# Patient Record
Sex: Male | Born: 1935 | Race: White | Hispanic: No | Marital: Married | Smoking: Never smoker
Health system: Southern US, Community
[De-identification: ages and names within clinical notes are randomized; demographics above are authoritative.]

## PROBLEM LIST (undated history)

## (undated) DIAGNOSIS — C61 Malignant neoplasm of prostate: Secondary | ICD-10-CM

## (undated) DIAGNOSIS — K648 Other hemorrhoids: Secondary | ICD-10-CM

## (undated) DIAGNOSIS — K5790 Diverticulosis of intestine, part unspecified, without perforation or abscess without bleeding: Secondary | ICD-10-CM

## (undated) DIAGNOSIS — J42 Unspecified chronic bronchitis: Secondary | ICD-10-CM

## (undated) DIAGNOSIS — K219 Gastro-esophageal reflux disease without esophagitis: Secondary | ICD-10-CM

## (undated) DIAGNOSIS — E785 Hyperlipidemia, unspecified: Secondary | ICD-10-CM

## (undated) DIAGNOSIS — D126 Benign neoplasm of colon, unspecified: Secondary | ICD-10-CM

## (undated) DIAGNOSIS — E119 Type 2 diabetes mellitus without complications: Secondary | ICD-10-CM

## (undated) HISTORY — DX: Malignant neoplasm of prostate: C61

## (undated) HISTORY — DX: Diverticulosis of intestine, part unspecified, without perforation or abscess without bleeding: K57.90

## (undated) HISTORY — PX: INGUINAL HERNIA REPAIR: SUR1180

## (undated) HISTORY — DX: Other hemorrhoids: K64.8

## (undated) HISTORY — PX: LASIK: SHX215

## (undated) HISTORY — PX: TOTAL KNEE ARTHROPLASTY: SHX125

## (undated) HISTORY — PX: APPENDECTOMY: SHX54

## (undated) HISTORY — DX: Hyperlipidemia, unspecified: E78.5

## (undated) HISTORY — DX: Benign neoplasm of colon, unspecified: D12.6

---
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
## 1978-08-24 HISTORY — PX: CHOLECYSTECTOMY: SHX55

## 1999-12-24 ENCOUNTER — Encounter: Admission: RE | Admit: 1999-12-24 | Discharge: 2000-01-14 | Payer: Self-pay | Admitting: Orthopedic Surgery

## 2000-10-05 ENCOUNTER — Ambulatory Visit (HOSPITAL_COMMUNITY): Admission: RE | Admit: 2000-10-05 | Discharge: 2000-10-05 | Payer: Self-pay | Admitting: *Deleted

## 2000-10-05 ENCOUNTER — Encounter: Payer: Self-pay | Admitting: *Deleted

## 2000-12-03 ENCOUNTER — Encounter (INDEPENDENT_AMBULATORY_CARE_PROVIDER_SITE_OTHER): Payer: Self-pay | Admitting: Specialist

## 2000-12-03 ENCOUNTER — Ambulatory Visit (HOSPITAL_COMMUNITY): Admission: RE | Admit: 2000-12-03 | Discharge: 2000-12-03 | Payer: Self-pay | Admitting: *Deleted

## 2001-09-22 ENCOUNTER — Encounter: Payer: Self-pay | Admitting: Urology

## 2001-09-22 ENCOUNTER — Encounter: Admission: RE | Admit: 2001-09-22 | Discharge: 2001-09-22 | Payer: Self-pay | Admitting: Urology

## 2002-08-24 DIAGNOSIS — D126 Benign neoplasm of colon, unspecified: Secondary | ICD-10-CM

## 2002-08-24 HISTORY — DX: Benign neoplasm of colon, unspecified: D12.6

## 2003-09-17 ENCOUNTER — Inpatient Hospital Stay (HOSPITAL_COMMUNITY): Admission: RE | Admit: 2003-09-17 | Discharge: 2003-09-18 | Payer: Self-pay | Admitting: Neurosurgery

## 2005-05-26 ENCOUNTER — Ambulatory Visit (HOSPITAL_COMMUNITY): Admission: RE | Admit: 2005-05-26 | Discharge: 2005-05-26 | Payer: Self-pay | Admitting: Chiropractic Medicine

## 2006-04-08 ENCOUNTER — Encounter: Admission: RE | Admit: 2006-04-08 | Discharge: 2006-04-08 | Payer: Self-pay | Admitting: Orthopedic Surgery

## 2007-08-08 ENCOUNTER — Encounter: Admission: RE | Admit: 2007-08-08 | Discharge: 2007-08-08 | Payer: Self-pay | Admitting: Orthopedic Surgery

## 2008-04-18 ENCOUNTER — Ambulatory Visit: Payer: Self-pay | Admitting: Gastroenterology

## 2008-07-30 ENCOUNTER — Ambulatory Visit: Payer: Self-pay | Admitting: Gastroenterology

## 2008-08-08 ENCOUNTER — Inpatient Hospital Stay (HOSPITAL_COMMUNITY): Admission: RE | Admit: 2008-08-08 | Discharge: 2008-08-12 | Payer: Self-pay | Admitting: Orthopedic Surgery

## 2008-09-05 ENCOUNTER — Encounter: Admission: RE | Admit: 2008-09-05 | Discharge: 2008-11-09 | Payer: Self-pay | Admitting: Orthopedic Surgery

## 2010-02-14 ENCOUNTER — Ambulatory Visit (HOSPITAL_COMMUNITY): Admission: RE | Admit: 2010-02-14 | Discharge: 2010-02-14 | Payer: Self-pay | Admitting: General Surgery

## 2010-11-09 LAB — GLUCOSE, CAPILLARY: Glucose-Capillary: 173 mg/dL — ABNORMAL HIGH (ref 70–99)

## 2010-11-09 LAB — COMPREHENSIVE METABOLIC PANEL
ALT: 23 U/L (ref 0–53)
AST: 21 U/L (ref 0–37)
Albumin: 4.1 g/dL (ref 3.5–5.2)
Alkaline Phosphatase: 57 U/L (ref 39–117)
BUN: 18 mg/dL (ref 6–23)
Chloride: 107 mEq/L (ref 96–112)
GFR calc Af Amer: >60 mL/min (ref >60)
Total Protein: 7.1 g/dL (ref 6.0–8.3)

## 2010-11-09 LAB — DIFFERENTIAL: Lymphocytes Relative: 17 % (ref 12–46)

## 2010-11-09 LAB — CBC
HCT: 39.7 % (ref 39.0–52.0)
Hemoglobin: 13 g/dL (ref 13.0–17.0)
RBC: 4.32 MIL/uL (ref 4.22–5.81)
RDW: 13.9 % (ref 11.5–15.5)
WBC: 7.6 10*3/uL (ref 4.0–10.5)

## 2010-11-09 LAB — SURGICAL PCR SCREEN
MRSA, PCR: NEGATIVE
Staphylococcus aureus: NEGATIVE

## 2011-01-06 NOTE — Op Note (Signed)
NAMEDARYAN, BUELL              ACCOUNT NO.:  [id number]   MEDICAL RECORD NO.:  [id number]          PATIENT TYPE:  INP   LOCATION:  NA                           FACILITY:  [facility]   PHYSICIAN:  Ollen Gross, M.D.    DATE OF BIRTH:  1932/04/28   DATE OF PROCEDURE:  08/08/2008  DATE OF DISCHARGE:                               OPERATIVE REPORT   PREOP DIAGNOSIS:  Osteoarthritis, right knee.   POSTOPERATIVE DIAGNOSIS:  Osteoarthritis, right knee.   PROCEDURE:  Right total knee arthroplasty.   SURGEON:  Ollen Gross, M.D.   ASSISTANT:  [name], P.A.C.   ANESTHESIA:  General with postop Marcaine pain pump.   ESTIMATED BLOOD LOSS:  Minimal.   DRAINS:  None.   TOURNIQUET TIME:  36 minutes at 300 mmHg.   COMPLICATIONS:  None.   CONDITION:  Stable to recovery.   CLINICAL NOTE:  Mr. Zalesky is a 77 year old male who has severe  endstage arthritis of the right knee with progressively worsening pain  and dysfunction.  He has failed arthroscopy and extensive nonoperative  management including injections. Presents now for right total knee  arthroplasty.   PROCEDURE IN DETAIL:  After successful administration of general  anesthetic, a tourniquet is placed high on his right thigh and right  lower extremity, prepped and draped in the usual sterile fashion.  Extremities wrapped in Esmarch, knee flexed, tourniquet inflated to 300  mmHg.  Midline incision made with a 10 blade through subcutaneous tissue  to the level of the extensor mechanism.  A fresh blade is used make a  medial parapatellar arthrotomy.  Soft tissue over the proximal medial  tibia is subperiosteally elevated to the joint line with the knife into  the semimembranosus bursa with a Cobb elevator.  Soft tissue laterally  is elevated with attention being paid to avoid patellar tendon on tibial  tubercle.  Patella subluxed laterally, knee flexed 90 degrees, and ACL  and PCL removed.  Drill was used create a  starting hole in the distal  femur and the canal was thoroughly irrigated.  Five-degree right valgus  alignment guide is placed to reference the posterior condyles, rotations  marked, and the block pinned to remove 11 mm of distal femur. It took 11  because of preop flexion contracture.  Distal femoral resection is made  with an oscillating saw.  Sizing blocks placed and sized, 5 is most  appropriate.  Rotations marked at the epicondylar axis.  Size 5 cutting  blocks placed and the anterior-posterior chamfer cuts made.   Tibia is subluxed forward and menisci removed.  The extramedullary  tibial alignment guide is placed referencing proximally at the medial  aspect of the tibial tubercle and distally along the second metatarsal  axis and tibial crest.  A block is pinned to remove 10 mm of the non  deficient lateral side.  Tibial resection is made with an oscillating  saw.  Osteophytes removed and size four is the most appropriate tibial  component.  The proximal tibia is prepared to modular drill and keel  punch for the size four.  Femoral preparation is completed with the  intercondylar cut for the size five.   Size four mobile bearing tibial trial size five posterior stabilized  femoral trial 810-mm posterior stabilized rotating platform insert trial  are placed.  The tendon and a tiny bit of anterior-posterior placed with  the 12.5 which allowed for full extension with excellent varus-valgus  anterior and posterior balance.  The patella was then everted and  thickness measured to be 25 mm.  Freehand resection was taken to 14 mm,  41 template is placed, lug holes were drilled, trial patella was placed  and it tracks normally.  Osteophytes removed off the posterior femur  with the trial in place.  All trials were removed and the cut bone  surfaces are prepared with pulsatile lavage.  Cement was mixed and once  ready for implantation, the size four mobile bearing tibial tray size  five  posterior stabilized femur and 41 patella are cemented into place.  Patella was held with a clamp.  Trial 12.5-mm insert is placed, knee  held in full extension and all extruded cement removed.  When cement is  fully hardened then the permanent 12.5 mm posterior stabilized rotating  platform insert is placed in the tibial tray.  Wounds copiously  irrigated with saline solution and FloSeal injected on the posterior  capsule, medial and lateral gutters and suprapatellar area.  Moist  sponge is placed and tourniquet released with total time of 36 minutes.  The sponge was held for 2 minutes then removed.  Minimal bleeding was  encountered.  The bleeding that is encountered is stopped with  electrocautery.  The wounds again irrigated and the arthrotomy closed  with interrupted #1 PDS.  Flexion against gravity is 140 degrees.  Subcu  is closed with interrupted 2-0 Vicryl subcuticular running 4-0 Monocryl.  Incisions cleaned and dried and then the catheter for the Marcaine pain  pump was placed and the pump initiated.  Steri-Strips and a bulky  sterile dressing were applied.  The knee was placed into a knee  immobilizer, the patient awakened and transported to recovery in stable  condition.      Ollen Gross, M.D.  Electronically Signed     FA/MEDQ  D:  08/08/2008  T:  08/09/2008  Job:  [id number]

## 2011-01-06 NOTE — H&P (Signed)
Andrew [name], Andrew [name]              ACCOUNT NO.:  [id number]   MEDICAL RECORD NO.:  [id number]          PATIENT TYPE:  INP   LOCATION:  1621                         FACILITY:  [facility]   PHYSICIAN:  Ollen Gross, M.D.    DATE OF BIRTH:  1932/01/05   DATE OF ADMISSION:  08/08/2008  DATE OF DISCHARGE:                              HISTORY & PHYSICAL   CHIEF COMPLAINT:  Right knee pain.   HISTORY OF PRESENT ILLNESS:  The patient is a 77 year old male who has  been seen by Dr. Lequita Halt for ongoing right knee pain. He was seen as a  second opinion with progressive pain in the right knee.  He is found to  have end-stage arthritis that has been ongoing for quite some time now  and is felt to be good candidate for surgery.  He has been seen by C.  Duane Lope, M.D. preoperatively and felt to be stable. Dr. Tenny Craw  recommended contacting [facility] hospitalist if there were any problems in  the hospital.  The patient was subsequently to [facility].   ALLERGIES:  No known drug allergies.   CURRENT MEDICATIONS:  Lipitor and Janumet.   PAST MEDICAL HISTORY:  Diabetes mellitus and also hyperlipidemia.   PAST SURGICAL HISTORY:  Gallbladder surgery and rotator cuff repair.   FAMILY HISTORY:  Mother with history of cancer.   SOCIAL HISTORY:  Widowed.  Business owner.  Nonsmoker.  No alcohol.  Lives alone.  He does have a caregiver lined up.  He also wants to look  into possible rehab stay. He does have a living will.   REVIEW OF SYSTEMS:  GENERAL:  No fevers, chills, night sweats.  NEURO:  No seizures, syncope, or paralysis.  RESPIRATORY:  No shortness breath,  productive cough, or hemoptysis.  CARDIOVASCULAR:  No chest pain or  orthopnea.  GI: No nausea, vomiting, diarrhea, constipation.  GU: No  dysuria, hematuria, or discharge.  MUSCULOSKELETAL:  Right knee.   PHYSICAL EXAMINATION:  VITAL SIGNS: Pulse 76, respirations 12, blood  pressure 120/62.  GENERAL: A 77 year old white male well-nourished,  well-developed, in no  acute distress. He is alert, oriented, and cooperative, good historian.  HEENT: Normocephalic, atraumatic.  Pupils are equal, round, and  reactive.  EOMs intact.  Oropharynx is clear.  Neck is supple.  No  bruits appreciated.  CHEST:  Clear anterior and posterior chest walls.  No rhonchi, rales,  wheezing.  HEART: Regular rhythm.  No murmur, S1-S2 noted.  ABDOMEN: Soft, nontender.  Bowel sounds present.  RECTAL, BREASTS, GENITALIA:  Not done, not pertinent to present illness.  EXTREMITIES:  Right knee:  Range of motion of the right knee shows 5-  130. Marked crepitus is noted.  Tender more medial than lateral in the  joint lines.   IMPRESSION:  Osteoarthritis right knee.   PLAN:  The patient was admitted to [facility] to undergo a  right total knee replacement arthroplasty.  Surgery will be performed by  Dr.  Ollen Gross. We will contact [facility] hospitalist if there are any  problems with the patient during the hospital course.  Andrew [name], P.A.C.      Ollen Gross, M.D.  Electronically Signed    ALP/MEDQ  D:  08/09/2008  T:  08/09/2008  Job:  [id number]   cc:   [name], M.D.  Fax: [phone]   Ollen Gross, M.D.  Fax: [phone]

## 2011-01-06 NOTE — Discharge Summary (Signed)
Andrew [name], [name]              ACCOUNT NO.:  [id number]   MEDICAL RECORD NO.:  [id number]          PATIENT TYPE:  INP   LOCATION:  1621                         FACILITY:  [facility]   PHYSICIAN:  Ollen Gross, M.D.    DATE OF BIRTH:  28-Jun-1932   DATE OF ADMISSION:  08/08/2008  DATE OF DISCHARGE:  08/12/2008                               DISCHARGE SUMMARY   ADMITTING DIAGNOSES:  1. Osteoarthritis, right knee.  2. Diabetes mellitus.  3. Hyperlipidemia.   DISCHARGE DIAGNOSES:  1. Osteoarthritis, right knee, status post right total knee      replacement arthroplasty.  2. Diabetes mellitus.  3. Hyperlipidemia.   PROCEDURE:  August 08, 2008, right total knee.   SURGEON:  Dr. Lequita Halt.   ASSISTANT:  Avel Peace, P.A.-C.   ANESTHESIA:  Genera and postop Marcaine pain pump.   CONSULTS:  None.   BRIEF HISTORY:  Andrew [name] is a 77 year old male with severe end-stage  arthritis of right knee, progressive worsening pain and dysfunction,  failed arthroscopy and extensive nonoperative management including  injections, now presents for total knee arthroplasty.   LABORATORY DATA:  Preop CBC showed a hemoglobin of 13.7, hematocrit of  41.6, white cell count 6.9, platelets 233.  Postop hemoglobin 11.7,  drifted down to 10.8.  Last noted H and H 11.1 and 33.2.  PT/PTT preop  13.1 and 33 respectively.  INR 1.0.  Serial pro times followed per  Coumadin protocol.  Last noted PT/INR 18.8 and 1.5.  Chem panel on  admission, slightly elevated total bili of 1.3, glucose elevated at 125.  Remaining Chem panel within normal limits.  Serial BMETs were followed.  Electrolytes remained within normal limits.  Preop UA, trace ketones,  otherwise negative.  Blood group type, B+.   EKG, July 31, 2008, normal sinus rhythm, nonspecific T-wave  abnormalities, no change from December 2005, confirmed by Dr. Bonnee Quin.   HOSPITAL COURSE:  Patient admitted to [facility], taken to the  OR,  underwent above-said procedure without any complication.  Patient  tolerated procedure well and later transferred to recovery room on  orthopedic floor and started PCA and p.o. analgesic pain control  following surgery.  Did get a little sleep the first night of surgery.  Actually doing pretty well on the morning of day 1.  Hemoglobin looked  good.  He took Janumet so we held the metformin portion of his diabetic  pill, started back on his other home meds.  Allowed him to be  weightbearing as tolerated.  He had an excellent urinary output.  He  actually got up and did very well, walked over 120 feet on day 1, weaned  over to p.o. meds.  PCA was discontinued by day 2, had excellent urinary  output.  Dressing change, incision looked good.  Continued to ambulate  well.  Remained in the hospital two more days.  We have arranged postop  home care.  Did have a little bit of leg pain on postop day 3.  His  Coumadin was not quite therapeutic, he was up to 1.4.  due to  the pain  and swelling, we observed him for 1 more day but by August 12, 2008,  he was doing well.  His Coumadin was slowly increasing.  He had a little  bit of redness with the swelling so he was treated prophylactically with  some Keflex by mouth.  Home arrangements were made and he was discharged  home on August 12, 2008.   PLAN:  1. Patient discharged home on August 12, 2008.  2. Discharge diagnoses:  Please see above.  3. Discharge meds:  Percocet, Robaxin, Coumadin, Lovenox 1 more day,      and then Keflex for 5 days.   FOLLOWUP:  Two weeks.   ACTIVITY:  Weightbearing as tolerated.  Total knee protocol.  Home  health PT and home health nursing.   DIET:  Heart-healthy diet.   DISPOSITION:  Home.   CONDITION UPON DISCHARGE:  Improved.      [name], P.A.C.      Ollen Gross, M.D.  Electronically Signed    ALP/MEDQ  D:  09/27/2008  T:  09/27/2008  Job:  [id number]   cc:   Ollen Gross,  M.D.  Fax: [phone]   [name], M.D.  Fax: [phone]

## 2011-01-09 NOTE — Op Note (Signed)
NAME:  Andrew [name], Andrew [name]                        ACCOUNT NO.:  [id number]   MEDICAL RECORD NO.:  [id number]                   PATIENT TYPE:  INP   LOCATION:  NA                                   FACILITY:  [facility]   PHYSICIAN:  Henry A. Pool, M.D.                 DATE OF BIRTH:  10/28/1931   DATE OF PROCEDURE:  09/17/2003  DATE OF DISCHARGE:                                 OPERATIVE REPORT   SERVICE:  Neurosurgery.   PREOPERATIVE DIAGNOSIS:  Right L4-5 and right L5-S1 spondylosis with  stenosis and radiculopathy.   POSTOPERATIVE DIAGNOSIS:  Right L4-5 and right L5-S1 spondylosis with  stenosis and radiculopathy.   PROCEDURE:  Right L4-5 and right L5-S1 decompressive laminotomy with  foraminotomies.   SURGEON:  Kathaleen Maser. Pool, M.D.   ASSISTANT:  Donalee Citrin, M.D.   ANESTHESIA:  General endotracheal.   INDICATION:  Mr. Tuminello is a 77 year old male with history of severe right  lower extremity radicular pain consistent with a right-sided L5  radiculopathy which has failed conservative management.  Workup has  demonstrated evidence of stenosis at L4-5 and at L5-S1 with compression of  the L5 nerve root within the lateral gutter at L4-5 and superiorly in the  lateral recess at L5-S1.  The patient has failed conservative management.  He presents now with two-level decompressive laminectomy and foraminotomies  for hopeful improvement of symptoms.   OPERATIVE NOTE:  The patient was brought to the operating room and placed on  the table in a supine position.  After an adequate level of anesthesia was  achieved, the patient was positioned prone onto a Wilson frame and properly  padded.  The patient's lumbar region was prepped and draped.  A 10 blade was  used to make an incision overlying the L4-5 interspace.  This area was  entered sharply in the midline.  Subperiosteal dissection was performed to  expose the lamina and facet joints of L4 and L5 on the right.  Deep self-  retainers were  placed.  Intraoperative x-rays were taken and level was  confirmed.  Decompressive laminotomy was then performed at L4-5 and L5-S1  using a high-speed drill and Kerrison rongeurs to remove the inferior aspect  of the lamina of L4 and the entire right L5 hemi-lamina and the medial  aspect of the L4-5 and L5-S1 facet joint complexes as well as the rim of the  L1 lamina.  The ligamentum flavum was then elevated and resected in  piecemeal fashion using Kerrison rongeurs at both levels.  The underlying  thecal sac and existing L5 and S1 nerve root were identified.  Microscope  was brought into the field to use for microdissection of the right-sided L5  nerve root.  A wide foraminotomy was then performed along the course of the  L5 nerve root by undercutting the facet joint as it tracks out into the  foramina.  A  foraminotomy was also performed along the course of the L1  nerve root.  The disks were inspected at both L5-S1 and L4-5.  Although they  were grossly degenerative and had some broad-based bulging, there was no  frank soft disk herniation.  It was felt that a simple decompression would  be the most appropriate course at both these levels.  Wound was then  irrigated with antibiotic solution.  Gelfoam was placed typically, where  hemostasis was found to be good.  Microscope and retractors were removed.  Hemostasis in the muscle was treated with  electrocautery.  Wound was then closed in layers with Vicryl suture.  Steri-  Strips and sterile dressing were applied.  There were no apparent  complications.  He tolerated the procedure well and he returned to the  recovery room  postop.                                               Henry A. Pool, M.D.    HAP/MEDQ  D:  09/17/2003  T:  09/17/2003  Job:  [id number]

## 2011-05-29 LAB — GLUCOSE, CAPILLARY
Glucose-Capillary: 116 mg/dL — ABNORMAL HIGH (ref 70–99)
Glucose-Capillary: 121 mg/dL — ABNORMAL HIGH (ref 70–99)
Glucose-Capillary: 141 mg/dL — ABNORMAL HIGH (ref 70–99)
Glucose-Capillary: 149 mg/dL — ABNORMAL HIGH (ref 70–99)
Glucose-Capillary: 151 mg/dL — ABNORMAL HIGH (ref 70–99)
Glucose-Capillary: 157 mg/dL — ABNORMAL HIGH (ref 70–99)
Glucose-Capillary: 170 mg/dL — ABNORMAL HIGH (ref 70–99)
Glucose-Capillary: 193 mg/dL — ABNORMAL HIGH (ref 70–99)

## 2011-05-29 LAB — CBC
HCT: 32.7 % — ABNORMAL LOW (ref 39.0–52.0)
HCT: 41.6 % (ref 39.0–52.0)
Hemoglobin: 11.1 g/dL — ABNORMAL LOW (ref 13.0–17.0)
Hemoglobin: 13.7 g/dL (ref 13.0–17.0)
MCHC: 33 g/dL (ref 30.0–36.0)
MCHC: 33.3 g/dL (ref 30.0–36.0)
MCV: 91.7 fL (ref 78.0–100.0)
MCV: 92 fL (ref 78.0–100.0)
MCV: 92.1 fL (ref 78.0–100.0)
Platelets: 170 10*3/uL (ref 150–400)
Platelets: 187 10*3/uL (ref 150–400)
Platelets: 233 10*3/uL (ref 150–400)
RBC: 3.88 MIL/uL — ABNORMAL LOW (ref 4.22–5.81)
RDW: 14 % (ref 11.5–15.5)
RDW: 14.2 % (ref 11.5–15.5)
RDW: 14.4 % (ref 11.5–15.5)
RDW: 14.4 % (ref 11.5–15.5)
WBC: 7.7 10*3/uL (ref 4.0–10.5)

## 2011-05-29 LAB — URINALYSIS, ROUTINE W REFLEX MICROSCOPIC
Protein, ur: NEGATIVE mg/dL
pH: 5.5 (ref 5.0–8.0)

## 2011-05-29 LAB — BASIC METABOLIC PANEL
BUN: 12 mg/dL (ref 6–23)
CO2: 26 mEq/L (ref 19–32)
Calcium: 8.7 mg/dL (ref 8.4–10.5)
Chloride: 104 mEq/L (ref 96–112)
Chloride: 104 mEq/L (ref 96–112)
Creatinine, Ser: 1 mg/dL (ref 0.4–1.5)
Creatinine, Ser: 1.04 mg/dL (ref 0.4–1.5)
GFR calc Af Amer: >60 mL/min (ref >60)
GFR calc non Af Amer: >60 mL/min (ref >60)
Glucose, Bld: 190 mg/dL — ABNORMAL HIGH (ref 70–99)
Potassium: 4.1 mEq/L (ref 3.5–5.1)
Sodium: 138 mEq/L (ref 135–145)

## 2011-05-29 LAB — PROTIME-INR
INR: 1.1 (ref 0.00–1.49)
INR: 1.4 (ref 0.00–1.49)
INR: 1.5 (ref 0.00–1.49)
Prothrombin Time: 15.3 seconds — ABNORMAL HIGH (ref 11.6–15.2)
Prothrombin Time: 18.8 seconds — ABNORMAL HIGH (ref 11.6–15.2)

## 2011-05-29 LAB — COMPREHENSIVE METABOLIC PANEL
ALT: 23 U/L (ref 0–53)
AST: 20 U/L (ref 0–37)
Alkaline Phosphatase: 62 U/L (ref 39–117)
CO2: 27 mEq/L (ref 19–32)
Calcium: 9.8 mg/dL (ref 8.4–10.5)
Chloride: 107 mEq/L (ref 96–112)
GFR calc non Af Amer: >60 mL/min (ref >60)
Glucose, Bld: 125 mg/dL — ABNORMAL HIGH (ref 70–99)
Total Protein: 6.3 g/dL (ref 6.0–8.3)

## 2011-05-29 LAB — TYPE AND SCREEN: ABO/RH(D): B POS

## 2011-05-29 LAB — ABO/RH: ABO/RH(D): B POS

## 2012-10-22 HISTORY — PX: LUMBAR DISC SURGERY: SHX700

## 2013-02-22 ENCOUNTER — Encounter: Payer: Self-pay | Admitting: Gastroenterology

## 2013-03-07 ENCOUNTER — Institutional Professional Consult (permissible substitution): Payer: Self-pay | Admitting: Internal Medicine

## 2013-03-09 ENCOUNTER — Encounter: Payer: Self-pay | Admitting: Internal Medicine

## 2013-03-09 ENCOUNTER — Ambulatory Visit: Payer: Self-pay | Admitting: Gastroenterology

## 2013-03-14 ENCOUNTER — Ambulatory Visit (INDEPENDENT_AMBULATORY_CARE_PROVIDER_SITE_OTHER): Payer: Medicare Other | Admitting: Neurology

## 2013-03-14 ENCOUNTER — Encounter: Payer: Self-pay | Admitting: Neurology

## 2013-03-14 VITALS — BP 136/80 | HR 68 | Temp 97.8°F | Resp 18 | Wt 202.0 lb

## 2013-03-14 DIAGNOSIS — H532 Diplopia: Secondary | ICD-10-CM

## 2013-03-14 DIAGNOSIS — G20 Parkinson's disease: Secondary | ICD-10-CM

## 2013-03-14 MED ORDER — CARBIDOPA-LEVODOPA 25-100 MG PO TABS: 1.0000 | ORAL_TABLET | Freq: Three times a day (TID) | ORAL | Status: DC

## 2013-03-14 NOTE — Patient Instructions (Addendum)
1. Start carbidopa/levodopa as follows: 1/2 tab three times a day before meals x 1 wk, then 1/2 in am & noon & 1 in evening for a week, then 1/2 in am &1 at noon &one in evening for a week, then 1 tablet three times a day before meals 2.  Exercise 3.  Follow up at the end of September. 4.  We will start working on your referral for physical and speech therapy and call you with those appointments.

## 2013-03-14 NOTE — Progress Notes (Addendum)
Andrew [name] was seen today in the movement disorders clinic for neurologic consultation at the request of Dr. Evlyn Kanner.  The consultation is for the evaluation of tremor and to r/o PD.  This patient is accompanied in the office by his spouse who supplements the history.   The first symptom(s) the patient noticed was tremor in the R pointer finger.  It started 3 months ago.  It was at rest.  He went to a neurologist in [place], [place].  He was not given a dx but was started on primidone.  He takes 50 mg tid and it seems to work.  His wife is not convinced that it really has helped, but the patient thinks that it does.  The patient underwent back surgery in [place] in March.  He states that the weakness and slowness not much worse after that.  He was very slow to recover.  His wife states that he was "zombie like."  He previously was very active with golfing and could not exercise.  He had no physical therapy.  Over the last month or so, he has gotten much stronger.  His wife actually thought he was so bad that he had a stroke and she took him to the hospital and a CT was performed that was unremarkable, with the exception of atrophy.  Specific Symptoms:  Tremor: yes (just in pointer finger on the R) Voice: voice changes x 6 months, hoarse.  Slurry.  He is hypophonic.   Sleep: gets up 1-2 times per night with nocturia  Vivid Dreams:  no  Acting out dreams:  yes (only kicking/jerking in sleep) Wet Pillows: no Postural symptoms:  yes  Falls?  no Bradykinesia symptoms: slow movements, slurred or difficult speech, drooling while awake and difficulty getting out of a chair Loss of smell:  no Loss of taste:  no Urinary Incontinence:  no Difficulty Swallowing:  no Handwriting, micrographia: yes per wife but pt denies Trouble with ADL's:  no  Trouble buttoning clothing: no Depression:  no Memory changes:  yes per wife (forgets things more than in past; pt denies) Hallucinations:  no  visual  distortions: no N/V:  no Lightheaded:  no  Syncope: no Diplopia:  yes (only with something up close x 1 month) Dyskinesia:  no    PREVIOUS MEDICATIONS: none to date  ALLERGIES:  No Known Allergies  CURRENT MEDICATIONS:    Medication List       This list is accurate as of: 03/14/13 11:45 AM.  Always use your most recent med list.               carbidopa-levodopa 25-100 MG per tablet  Commonly known as:  SINEMET IR  Take 1 tablet by mouth 3 (three) times daily.     celecoxib 200 MG capsule  Commonly known as:  CELEBREX  Take 200 mg by mouth 2 (two) times daily.     CRESTOR 40 MG tablet  Generic drug:  rosuvastatin  Take 40 mg by mouth daily. Two daily     EDTA Powd  by Does not apply route.     esomeprazole 40 MG capsule  Commonly known as:  NEXIUM  Take 40 mg by mouth daily before breakfast.     naproxen sodium 220 MG tablet  Commonly known as:  ANAPROX  Take 220 mg by mouth 2 (two) times daily with a meal.     PRIMIDONE PO  Take by mouth.     sitaGLIPtan-metformin 50-1000 MG per  tablet  Commonly known as:  JANUMET  Take 1 tablet by mouth 2 (two) times daily with a meal.        PAST MEDICAL HISTORY:   Past Medical History  Diagnosis Date  . Diabetes mellitus   . Hyperlipidemia   . Prostate cancer   . Diverticulosis   . Internal hemorrhoids   . Adenomatous colon polyp 2004    PAST SURGICAL HISTORY:   Past Surgical History  Procedure Laterality Date  . Cholecystectomy  1980  . Total knee arthroplasty Right   . Lasik    . Back surgery  10/2012    L1-L3    SOCIAL HISTORY:   History   Social History  . Marital Status: Married    Spouse Name: N/A    Number of Children: N/A  . Years of Education: N/A   Occupational History  . retired     cross country trucking   Social History Main Topics  . Smoking status: Never Smoker   . Smokeless tobacco: Never Used  . Alcohol Use: Yes     Comment: very seldom  . Drug Use: Not on file  .  Sexually Active: Not on file   Other Topics Concern  . Not on file   Social History Narrative  . No narrative on file    FAMILY HISTORY:   Family Status  Relation Status Death Age  . Mother Deceased     CA, throat  . Father Deceased 64  . Brother Deceased     2, one in MVA; one old age  . Sister Deceased 11  . Sister Deceased     "old age"  . Sister Alive     2, DM-2, hyperlipidemia, CAD  . Child Alive     2, one with hx of breast CA    ROS:  Feels like is running out of breath and then has "low voice."  Sometimes coughs for unknown reason.  A complete 10 system review of systems was obtained and was unremarkable apart from what is mentioned above.  PHYSICAL EXAMINATION:    VITALS:   Filed Vitals:   03/14/13 1019  BP: 136/80  Pulse: 68  Temp: 97.8 F (36.6 C)  Resp: 18  Weight: 202 lb (91.627 kg)    GEN:  The patient appears stated age and is in NAD. HEENT:  Normocephalic, atraumatic.  The mucous membranes are moist. The superficial temporal arteries are without ropiness or tenderness. CV:  RRR Lungs:  CTAB (after cough) Neck/HEME:  There are no carotid bruits bilaterally.  Neurological examination:  Orientation: The patient is alert and oriented x3. Fund of knowledge is appropriate.  Recent and remote memory are intact.  Attention and concentration are normal.    Able to name objects and repeat phrases. Cranial nerves: There is good facial symmetry.  There is significant facial hypomimia. Pupils are equal round and reactive to light bilaterally. Fundoscopic exam reveals clear margins bilaterally. Extraocular muscles are intact but there may be slight downgaze paresis. The visual fields are full to confrontational testing. The speech is hypophonic, dysarthric and he has difficulty with the guttural sounds.  It is fluent. Soft palate rises symmetrically and there is no tongue deviation. Hearing is intact to conversational tone. Sensation: Sensation is intact to light  and pinprick throughout (facial, trunk, extremities). Vibration is intact at the bilateral big toe. There is no extinction with double simultaneous stimulation. There is no sensory dermatomal level identified. Motor: Strength is 5/5 in  the bilateral upper and lower extremities.   Shoulder shrug is equal and symmetric.  There is no pronator drift. Deep tendon reflexes: Deep tendon reflexes are 2/4 at the bilateral biceps, triceps, brachioradialis, patella and achilles. Plantar responses are downgoing bilaterally.  Movement examination: Tone: There is mildly increased tone in the bilateral upper extremities.  The tone in the lower extremities is normal.  Abnormal movements: There is a bilateral upper extremity resting tremor, right greater than left. Coordination:  There is definite decremation with RAM's, seen bilaterally and with all forms of rapid alternating movements, right greater than left. Gait and Station: The patient has no difficulty arising out of a deep-seated chair without the use of the hands. The patient's stride length is decreased.  There is an increase in the right upper extremity tremor with ambulation.  He turns en bloc..  The patient has a positive pull test.      ASSESSMENT/PLAN:  1.  Parkinsonism.  The differential diagnosis lies between idiopathic Parkinson's disease and PSP.  I am favoring PSP, as he definitely had a pseudobulbar affect (laughter) and he does have diplopia and mild downgaze paresis.  He does have a little tremor, which goes against PSP, but otherwise he has symptoms more consistent with a tauopathy than alpha synucleinopathy.  Time will certainly reveal this to [place] and tx is really the same, although prognosis is different.  -I am going to start him on carbidopa/levodopa 25/100.  Risks, benefits, side effects and alternative therapies were discussed.  The opportunity to ask questions was given and they were answered to the best of my ability.  The patient  expressed understanding and willingness to follow the outlined treatment protocols.  -I. wanted to refer him to the Parkinson's therapy program, but he does not live close.  I'm going to contact the hospital near where he lives and see if they have a program.  -The patient will be moving back to [place] at the end of the year.  I gave him the name of a movement disorder physician there to follow with  -I. would like to see him back in the next 8 weeks, before he moves back to [place].  -Greater than 50% of the visit was spent in counseling discussing safety, the importance of exercise, pathophysiology and prognosis.  Face-to-face time was 80 minutes.

## 2013-03-15 ENCOUNTER — Other Ambulatory Visit: Payer: Self-pay

## 2013-03-15 DIAGNOSIS — G20 Parkinson's disease: Secondary | ICD-10-CM

## 2013-04-14 ENCOUNTER — Ambulatory Visit (INDEPENDENT_AMBULATORY_CARE_PROVIDER_SITE_OTHER): Payer: Medicare Other | Admitting: Internal Medicine

## 2013-04-14 ENCOUNTER — Ambulatory Visit (INDEPENDENT_AMBULATORY_CARE_PROVIDER_SITE_OTHER)
Admission: RE | Admit: 2013-04-14 | Discharge: 2013-04-14 | Disposition: A | Discharge status: Home / Self Care | Payer: Medicare Other | Source: Ambulatory Visit | Attending: Internal Medicine | Admitting: Internal Medicine

## 2013-04-14 ENCOUNTER — Encounter: Payer: Self-pay | Admitting: Internal Medicine

## 2013-04-14 VITALS — BP 150/88 | HR 87 | Temp 98.0°F | Ht 72.0 in | Wt 200.0 lb

## 2013-04-14 DIAGNOSIS — R0609 Other forms of dyspnea: Secondary | ICD-10-CM

## 2013-04-14 DIAGNOSIS — R0600 Dyspnea, unspecified: Secondary | ICD-10-CM

## 2013-04-14 MED ORDER — ESOMEPRAZOLE MAGNESIUM 40 MG PO CPDR: DELAYED_RELEASE_CAPSULE | ORAL | Status: AC

## 2013-04-14 NOTE — Progress Notes (Signed)
Subjective:    Patient ID: Andrew [name], male    DOB: 1932-07-24  MRN: [id number]  HPI  89 yowm never smoker never respiratory problems of any kind referred  to pulmonary clinic 04/14/2013  By  Dr Duane Lope with new onset cough and sob starting around May 2014 .   04/14/2013 1st [facility] office visit/ Leland Staszewski cc loss of voice fairly abrupt onset  "either a year ago or May of 2014, he's just not sure" but thinks was eval several months eval by ENT in [place] with  No dx, assoc with loss breath, dry cough, really limted by back more than breathing.  Already rx ppi and rx for parkinson's dz by Dr Tat but no better yet. No frank asp events to date or worsening around meal time.    Seems wore generally later in the day and better earlier but otherwise no obvious pattern to day to day or  daytime variabilty or assoc chronic cough or cp or chest tightness, subjective wheeze overt sinus or hb symptoms. No unusual exp hx or h/o childhood pna/ asthma or knowledge of premature birth.   Sleeping ok without nocturnal  or early am exacerbation  of respiratory  c/o's or need for noct saba. Also denies any obvious fluctuation of symptoms with weather or environmental changes or other aggravating or alleviating factors except as outlined above   Current Medications, Allergies, Past Medical History, Past Surgical History, Family History, and Social History were reviewed in [employer] record.        Review of Systems  Constitutional: Negative for fever, chills, diaphoresis, activity change, appetite change, fatigue and unexpected weight change.  HENT: Negative for hearing loss, ear pain, nosebleeds, congestion, sore throat, facial swelling, rhinorrhea, sneezing, mouth sores, trouble swallowing, neck pain, neck stiffness, dental problem, voice change, postnasal drip, sinus pressure, tinnitus and ear discharge.   Eyes: Negative for photophobia, discharge, itching and visual  disturbance.  Respiratory: Positive for cough and shortness of breath. Negative for apnea, choking, chest tightness, wheezing and stridor.   Cardiovascular: Negative for chest pain, palpitations and leg swelling.  Gastrointestinal: Negative for nausea, vomiting, abdominal pain, constipation, blood in stool and abdominal distention.  Genitourinary: Negative for dysuria, urgency, frequency, hematuria, flank pain, decreased urine volume and difficulty urinating.  Musculoskeletal: Positive for myalgias, joint swelling and arthralgias. Negative for back pain and gait problem.  Skin: Negative for color change, pallor and rash.  Neurological: Negative for dizziness, tremors, seizures, syncope, speech difficulty, weakness, light-headedness, numbness and headaches.  Hematological: Negative for adenopathy. Does not bruise/bleed easily.  Psychiatric/Behavioral: Negative for confusion, sleep disturbance and agitation. The patient is not nervous/anxious.        Objective:   Physical Exam  Wt Readings from Last 3 Encounters:  04/14/13 200 lb (90.719 kg)  03/14/13 202 lb (91.627 kg)   amb wm with severe voice fatigue and pseudowheeze, has a great deal of difficulty with hx details since the onset of his symptoms   HEENT: nl dentition, turbinates, and orophanx. Nl external ear canals without cough reflex   NECK :  without JVD/Nodes/TM/ nl carotid upstrokes bilaterally   LUNGS: no acc muscle use, clear to A and P bilaterally without cough on insp or exp maneuvers   CV:  RRR  no s3 or murmur or increase in P2, no edema   ABD:  soft and nontender with nl excursion in the supine position. No bruits or organomegaly, bowel sounds nl  MS:  warm without deformities, calf tenderness, cyanosis or clubbing  SKIN: warm and dry without lesions    NEURO:  alert, approp, no deficits    CXR  04/14/2013 :  No acute cardiopulmonary abnormality seen.      Assessment & Plan:

## 2013-04-14 NOTE — Patient Instructions (Addendum)
Change nexium to 40 mg Take 30- 60 min before your first and last meals of the day   GERD (REFLUX)  is an extremely common cause of respiratory symptoms, many times with no significant heartburn at all.    It can be treated with medication, but also with lifestyle changes including avoidance of late meals, excessive alcohol, smoking cessation, and avoid fatty foods, chocolate, peppermint, colas, red wine, and acidic juices such as orange juice.  NO MINT OR MENTHOL PRODUCTS SO NO COUGH DROPS  USE SUGARLESS CANDY INSTEAD (jolley ranchers or Stover's)  NO OIL BASED VITAMINS - use powdered substitutes.   Please remember to go to the xray  department downstairs for your tests - we will call you with the results when they are available.  If throat problem not better by 4 weeks you will need to return to your throat doctor in [place]

## 2013-04-15 NOTE — Assessment & Plan Note (Addendum)
-   04/14/2013  Walked RA x 3 laps @ 185 ft each stopped due to end of study no desat   - 04/14/13 spirometry wnl  Symptoms are markedly disproportionate to objective findings and not clear this is a lung problem but pt does appear to have difficult airway management issues. DDX of  difficult airways managment all start with A and  include Adherence, Ace Inhibitors, Acid Reflux, Active Sinus Disease, Alpha 1 Antitripsin deficiency, Anxiety masquerading as Airways dz,  ABPA,  allergy(esp in young), Aspiration (esp in elderly), Adverse effects of DPI,  Active smokers, plus two Bs  = Bronchiectasis and Beta blocker use..and one C= CHF  Acid reflux and non-acid reflux at the top of the list here Asp also a concern based on his parkinson's dz   For now max rx for gerd plus diet/ f/u neuro and ENT > i'm concerned that he has 3 different centers of care not using the same EMR and he's very iffy on details of whom he is seeing for what so will use the fewer cooks in the kitchen principle and let Dr Tenny Craw decide when/ if further pulmonary input is needed  See instructions for specific recommendations which were reviewed directly with the patient who was given a copy with highlighter outlining the key components.

## 2013-04-20 ENCOUNTER — Telehealth: Payer: Self-pay | Admitting: Internal Medicine

## 2013-04-20 NOTE — Telephone Encounter (Signed)
This has been faxed over Nothing further needed

## 2013-04-26 NOTE — Progress Notes (Signed)
Quick Note:  LMOM TCB x2. ______ 

## 2013-05-02 NOTE — Progress Notes (Signed)
Quick Note:  Spoke with pt and notified of results per Dr. Wert. Pt verbalized understanding and denied any questions.  ______ 

## 2013-05-08 ENCOUNTER — Ambulatory Visit: Payer: MEDICARE | Admitting: Neurology

## 2013-05-11 ENCOUNTER — Ambulatory Visit (INDEPENDENT_AMBULATORY_CARE_PROVIDER_SITE_OTHER): Payer: Medicare Other | Admitting: Neurology

## 2013-05-11 VITALS — BP 122/74 | HR 60 | Temp 97.4°F | Resp 16 | Ht 72.0 in | Wt 200.0 lb

## 2013-05-11 DIAGNOSIS — G20 Parkinson's disease: Secondary | ICD-10-CM

## 2013-05-11 MED ORDER — PRIMIDONE 50 MG PO TABS: 50.0000 mg | ORAL_TABLET | Freq: Three times a day (TID) | ORAL | Status: DC

## 2013-05-11 MED ORDER — CARBIDOPA-LEVODOPA 25-100 MG PO TABS: 1.0000 | ORAL_TABLET | Freq: Three times a day (TID) | ORAL | Status: DC

## 2013-05-11 NOTE — Progress Notes (Signed)
Andrew [name] was seen today in the movement disorders clinic for neurologic consultation at the request of Dr. Evlyn Kanner.  The consultation is for the evaluation of tremor and to r/o PD.  This patient is accompanied in the office by his spouse who supplements the history.   The first symptom(s) the patient noticed was tremor in the R pointer finger.  It started 3 months ago.  It was at rest.  He went to a neurologist in [place], [place].  He was not given a dx but was started on primidone.  He takes 50 mg tid and it seems to work.  His wife is not convinced that it really has helped, but the patient thinks that it does.  The patient underwent back surgery in [place] in March.  He states that the weakness and slowness got much worse after that.  He was very slow to recover.  His wife states that he was "zombie like."  He previously was very active with golfing and could not exercise.  He had no physical therapy.  Over the last month or so, he has gotten much stronger.  His wife actually thought he was so bad that he had a stroke and she took him to the hospital and a CT was performed that was unremarkable, with the exception of atrophy.  05/11/13 update:  The patient is following up today regarding his parkinsonism.  He was started on carbidopa/levodopa last visit and thinks that it has been somewhat helpful.  He remains on primidone, and thinks that it helps the tremor significantly, so does not want to go off of that and asks for a refill.  The patient is getting ready to return to [place], [place] where he lives most of the year.  He will be gone from October until next June.  He has had no falls.  He has had no hallucinations.  He admits to tremor.  He has had no dizziness or lightheadedness.  He continues to struggle with chronic back pain.  He continues to play golf, but states that his golf game is not as good as it used to be.    PREVIOUS MEDICATIONS: none to date  ALLERGIES:  No Known  Allergies  CURRENT MEDICATIONS:    Medication List       This list is accurate as of: 05/11/13 11:22 AM.  Always use your most recent med list.               carbidopa-levodopa 25-100 MG per tablet  Commonly known as:  SINEMET IR  Take 1 tablet by mouth 3 (three) times daily.     celecoxib 200 MG capsule  Commonly known as:  CELEBREX  Take 200 mg by mouth daily.     CRESTOR 40 MG tablet  Generic drug:  rosuvastatin  Take 40 mg by mouth daily. Two daily     esomeprazole 40 MG capsule  Commonly known as:  NEXIUM  Take 30- 60 min before your first and last meals of the day     PRIMIDONE PO  Take by mouth 3 (three) times daily.     sitaGLIPtin-metformin 50-1000 MG per tablet  Commonly known as:  JANUMET  Take 1 tablet by mouth 2 (two) times daily with a meal.        PAST MEDICAL HISTORY:   Past Medical History  Diagnosis Date  . Diabetes mellitus   . Hyperlipidemia   . Prostate cancer   . Diverticulosis   . Internal  hemorrhoids   . Adenomatous colon polyp 2004    PAST SURGICAL HISTORY:   Past Surgical History  Procedure Laterality Date  . Cholecystectomy  1980  . Total knee arthroplasty Right   . Lasik    . Back surgery  10/2012    L1-L3    SOCIAL HISTORY:   History   Social History  . Marital Status: Married    Spouse Name: N/A    Number of Children: N/A  . Years of Education: N/A   Occupational History  . retired     cross country trucking   Social History Main Topics  . Smoking status: Never Smoker   . Smokeless tobacco: Never Used  . Alcohol Use: Yes     Comment: very seldom  . Drug Use: Not on file  . Sexual Activity: Not on file   Other Topics Concern  . Not on file   Social History Narrative  . No narrative on file    FAMILY HISTORY:   Family Status  Relation Status Death Age  . Mother Deceased     CA, throat  . Father Deceased 21  . Brother Deceased     2, one in MVA; one old age  . Sister Deceased 18  . Sister  Deceased     "old age"  . Sister Alive     2, DM-2, hyperlipidemia, CAD  . Child Alive     2, one with hx of breast CA    ROS:  Feels like is running out of breath and then has "low voice."  Sometimes coughs for unknown reason.  A complete 10 system review of systems was obtained and was unremarkable apart from what is mentioned above.  PHYSICAL EXAMINATION:    VITALS:   Filed Vitals:   05/11/13 1110  BP: 122/74  Pulse: 60  Temp: 97.4 F (36.3 C)  Resp: 16  Height: 6' (1.829 m)  Weight: 200 lb (90.719 kg)    GEN:  The patient appears stated age and is in NAD. HEENT:  Normocephalic, atraumatic.  The mucous membranes are moist. The superficial temporal arteries are without ropiness or tenderness. CV:  RRR Lungs:  CTAB (after cough) Neck/HEME:  There are no carotid bruits bilaterally.  Neurological examination:  Orientation: The patient is alert and oriented x3. Fund of knowledge is appropriate.  Recent and remote memory are intact.  Attention and concentration are normal.    Able to name objects and repeat phrases. Cranial nerves: There is good facial symmetry.  There is significant facial hypomimia. Pupils are equal round and reactive to light bilaterally. Fundoscopic exam reveals clear margins bilaterally. Extraocular muscles are intact but there may be slight downgaze paresis. The visual fields are full to confrontational testing. The speech is hypophonic, dysarthric and he has difficulty with the guttural sounds.  It is fluent. Soft palate rises symmetrically and there is no tongue deviation. Hearing is intact to conversational tone. Sensation: Sensation is intact to light and pinprick throughout (facial, trunk, extremities). Vibration is intact at the bilateral big toe. There is no extinction with double simultaneous stimulation. There is no sensory dermatomal level identified. Motor: Strength is 5/5 in the bilateral upper and lower extremities.   Shoulder shrug is equal and  symmetric.  There is no pronator drift. Deep tendon reflexes: Deep tendon reflexes are 2/4 at the bilateral biceps, triceps, brachioradialis, patella and achilles. Plantar responses are downgoing bilaterally.  Movement examination: Tone: There is mildly increased tone in the left  upper extremity (last visit bilateral).  The tone in the lower extremities is normal.  Abnormal movements: There is a bilateral upper extremity resting tremor, right greater than left.  This is overall mild and intermittent bilaterally. Coordination:  There is definite decremation with RAM's, seen bilaterally and with all forms of rapid alternating movements, right greater than left. Gait and Station: The patient has no difficulty arising out of a deep-seated chair without the use of the hands. The patient's stride length is decreased.  There is an increase in the right upper extremity tremor with ambulation.  He turns en bloc.  ASSESSMENT/PLAN:  1.  Parkinsonism.  The differential diagnosis lies between idiopathic Parkinson's disease and PSP.  I am favoring PSP, as he definitely had a pseudobulbar affect (laughter) and he does have diplopia and mild downgaze paresis.  He does have a little tremor, which goes against PSP, but otherwise he has symptoms more consistent with a tauopathy than alpha synucleinopathy.  Time will certainly reveal this to [place] and tx is really the same, although prognosis is different.  -I am going to continue him on carbidopa/levodopa 25/100.  In the future, he may need a higher dosage. Risks, benefits, side effects and alternative therapies were discussed.  The opportunity to ask questions was given and they were answered to the best of my ability.  The patient expressed understanding and willingness to follow the outlined treatment protocols.  -The patient will be moving back to [place] at the end of the year.  I gave him the name of a movement disorder physician there to follow with and sent a  referral today.  -I did refill his primidone 50 mg 3 times a day as he thinks that this helps tremor independently.  -I will plan on seeing him back next summer, when he returns from [place].

## 2013-05-11 NOTE — Patient Instructions (Signed)
1.  We will send a referral to [facility] in [place], [place] with Dr. Holley Bouche. 2.  Make a follow up appointment for the first of June 2015 on your way out today. Thank You!

## 2013-07-10 ENCOUNTER — Telehealth: Payer: Self-pay | Admitting: Neurology

## 2013-07-10 NOTE — Telephone Encounter (Signed)
I called the pt and had to leave v/m message for him to return call.

## 2013-07-10 NOTE — Telephone Encounter (Signed)
Pt wants CB from Dr. Arbutus Leas, says he only wants Dr. Arbutus Leas to call. Pt says it is regarding a Personnel officer. Advised pt that Dr. Don Perking nurse will be calling for additional details / Sherri

## 2013-07-10 NOTE — Telephone Encounter (Signed)
Do you want me to call him?

## 2013-09-25 ENCOUNTER — Telehealth: Payer: Self-pay | Admitting: *Deleted

## 2013-09-25 NOTE — Telephone Encounter (Signed)
I called patient to give him the telephone number for  Dr Forde Dandy  [phone] he did not answer his phone .

## 2013-10-31 ENCOUNTER — Other Ambulatory Visit: Payer: Self-pay | Admitting: Neurosurgery

## 2013-10-31 DIAGNOSIS — M4800 Spinal stenosis, site unspecified: Secondary | ICD-10-CM

## 2014-01-04 ENCOUNTER — Telehealth: Payer: Self-pay | Admitting: Neurology

## 2014-01-04 MED ORDER — CARBIDOPA-LEVODOPA 25-100 MG PO TABS: 1.0000 | ORAL_TABLET | Freq: Three times a day (TID) | ORAL | Status: DC

## 2014-01-04 NOTE — Telephone Encounter (Signed)
Refill sent to pharmacy.   

## 2014-01-04 NOTE — Telephone Encounter (Signed)
Need refill authorization for Sinemet 25/100mg  #270. Sig: 1 by month three times per day. 3 month supply. Please call [facility] # [id number] / Sherri S.

## 2014-01-09 ENCOUNTER — Other Ambulatory Visit: Payer: Medicare Other

## 2014-01-22 ENCOUNTER — Ambulatory Visit (INDEPENDENT_AMBULATORY_CARE_PROVIDER_SITE_OTHER): Payer: Medicare Other | Admitting: Neurology

## 2014-01-22 ENCOUNTER — Emergency Department (HOSPITAL_COMMUNITY): Payer: Medicare Other

## 2014-01-22 ENCOUNTER — Encounter: Payer: Self-pay | Admitting: Neurology

## 2014-01-22 ENCOUNTER — Encounter (HOSPITAL_COMMUNITY): Payer: Self-pay | Admitting: Emergency Medicine

## 2014-01-22 ENCOUNTER — Inpatient Hospital Stay (HOSPITAL_COMMUNITY)
Admission: EM | Admit: 2014-01-22 | Discharge: 2014-01-25 | DRG: 176 | Disposition: A | Discharge status: Home / Self Care | Payer: Medicare Other | Attending: Internal Medicine | Admitting: Internal Medicine

## 2014-01-22 ENCOUNTER — Telehealth: Payer: Self-pay | Admitting: Neurology

## 2014-01-22 VITALS — BP 118/54 | HR 122 | Ht 72.0 in | Wt 183.0 lb

## 2014-01-22 DIAGNOSIS — E119 Type 2 diabetes mellitus without complications: Secondary | ICD-10-CM | POA: Diagnosis present

## 2014-01-22 DIAGNOSIS — I2699 Other pulmonary embolism without acute cor pulmonale: Principal | ICD-10-CM | POA: Diagnosis present

## 2014-01-22 DIAGNOSIS — R0902 Hypoxemia: Secondary | ICD-10-CM

## 2014-01-22 DIAGNOSIS — Z8546 Personal history of malignant neoplasm of prostate: Secondary | ICD-10-CM

## 2014-01-22 DIAGNOSIS — G20A1 Parkinson's disease without dyskinesia, without mention of fluctuations: Secondary | ICD-10-CM | POA: Diagnosis present

## 2014-01-22 DIAGNOSIS — R918 Other nonspecific abnormal finding of lung field: Secondary | ICD-10-CM

## 2014-01-22 DIAGNOSIS — G238 Other specified degenerative diseases of basal ganglia: Secondary | ICD-10-CM

## 2014-01-22 DIAGNOSIS — R0989 Other specified symptoms and signs involving the circulatory and respiratory systems: Secondary | ICD-10-CM

## 2014-01-22 DIAGNOSIS — G20 Parkinson's disease: Secondary | ICD-10-CM | POA: Diagnosis present

## 2014-01-22 DIAGNOSIS — Z923 Personal history of irradiation: Secondary | ICD-10-CM

## 2014-01-22 DIAGNOSIS — I824Z9 Acute embolism and thrombosis of unspecified deep veins of unspecified distal lower extremity: Secondary | ICD-10-CM | POA: Diagnosis present

## 2014-01-22 DIAGNOSIS — R1310 Dysphagia, unspecified: Secondary | ICD-10-CM | POA: Diagnosis present

## 2014-01-22 DIAGNOSIS — Z96659 Presence of unspecified artificial knee joint: Secondary | ICD-10-CM

## 2014-01-22 DIAGNOSIS — I498 Other specified cardiac arrhythmias: Secondary | ICD-10-CM | POA: Diagnosis present

## 2014-01-22 DIAGNOSIS — R053 Chronic cough: Secondary | ICD-10-CM

## 2014-01-22 DIAGNOSIS — R0609 Other forms of dyspnea: Secondary | ICD-10-CM

## 2014-01-22 DIAGNOSIS — R05 Cough: Secondary | ICD-10-CM

## 2014-01-22 DIAGNOSIS — Z79899 Other long term (current) drug therapy: Secondary | ICD-10-CM

## 2014-01-22 DIAGNOSIS — E785 Hyperlipidemia, unspecified: Secondary | ICD-10-CM | POA: Diagnosis present

## 2014-01-22 DIAGNOSIS — R0600 Dyspnea, unspecified: Secondary | ICD-10-CM

## 2014-01-22 DIAGNOSIS — G231 Progressive supranuclear ophthalmoplegia [Steele-Richardson-Olszewski]: Secondary | ICD-10-CM

## 2014-01-22 DIAGNOSIS — R000 Tachycardia, unspecified: Secondary | ICD-10-CM

## 2014-01-22 HISTORY — DX: Gastro-esophageal reflux disease without esophagitis: K21.9

## 2014-01-22 HISTORY — DX: Type 2 diabetes mellitus without complications: E11.9

## 2014-01-22 HISTORY — DX: Unspecified chronic bronchitis: J42

## 2014-01-22 LAB — HEPATIC FUNCTION PANEL
ALK PHOS: 112 U/L (ref 39–117)
ALT: 19 U/L (ref 0–53)
AST: 15 U/L (ref 0–37)
Albumin: 3.7 g/dL (ref 3.5–5.2)
BILIRUBIN TOTAL: 0.7 mg/dL (ref 0.3–1.2)
Bilirubin, Direct: <0.2 mg/dL (ref 0.0–0.3)
TOTAL PROTEIN: 7.9 g/dL (ref 6.0–8.3)

## 2014-01-22 LAB — CBC
HCT: 36.2 % — ABNORMAL LOW (ref 39.0–52.0)
Hemoglobin: 12.3 g/dL — ABNORMAL LOW (ref 13.0–17.0)
MCH: 33 pg (ref 26.0–34.0)
MCHC: 34 g/dL (ref 30.0–36.0)
MCV: 97.1 fL (ref 78.0–100.0)
PLATELETS: 145 10*3/uL — AB (ref 150–400)
RBC: 3.73 MIL/uL — AB (ref 4.22–5.81)
RDW: 16.1 % — ABNORMAL HIGH (ref 11.5–15.5)
WBC: 6.3 10*3/uL (ref 4.0–10.5)

## 2014-01-22 LAB — PROTIME-INR
INR: 0.99 (ref 0.00–1.49)
Prothrombin Time: 12.9 seconds (ref 11.6–15.2)

## 2014-01-22 LAB — BASIC METABOLIC PANEL
BUN: 23 mg/dL (ref 6–23)
CALCIUM: 10.4 mg/dL (ref 8.4–10.5)
CO2: 21 meq/L (ref 19–32)
Chloride: 101 mEq/L (ref 96–112)
Creatinine, Ser: 0.96 mg/dL (ref 0.50–1.35)
GFR calc Af Amer: 89 mL/min — ABNORMAL LOW (ref >90)
GFR calc non Af Amer: 77 mL/min — ABNORMAL LOW (ref >90)
Glucose, Bld: 197 mg/dL — ABNORMAL HIGH (ref 70–99)
POTASSIUM: 4.3 meq/L (ref 3.7–5.3)
SODIUM: 139 meq/L (ref 137–147)

## 2014-01-22 LAB — D-DIMER, QUANTITATIVE: D-Dimer, Quant: 2.3 ug/mL-FEU — ABNORMAL HIGH (ref 0.00–0.48)

## 2014-01-22 LAB — PRO B NATRIURETIC PEPTIDE: PRO B NATRI PEPTIDE: 84.1 pg/mL (ref 0–450)

## 2014-01-22 LAB — I-STAT TROPONIN, ED: TROPONIN I, POC: 0 ng/mL (ref 0.00–0.08)

## 2014-01-22 MED ORDER — LINAGLIPTIN 5 MG PO TABS
5.0000 mg | ORAL_TABLET | Freq: Every day | ORAL | Status: DC
  Administered 2014-01-23 – 2014-01-25 (×3): 5 mg via ORAL
  Filled 2014-01-22 (×5): qty 1

## 2014-01-22 MED ORDER — ACETAMINOPHEN 325 MG PO TABS: 650.0000 mg | ORAL_TABLET | Freq: Four times a day (QID) | ORAL | Status: DC | PRN

## 2014-01-22 MED ORDER — ACETAMINOPHEN 650 MG RE SUPP: 650.0000 mg | Freq: Four times a day (QID) | RECTAL | Status: DC | PRN

## 2014-01-22 MED ORDER — SODIUM CHLORIDE 0.9 % IJ SOLN
3.0000 mL | Freq: Two times a day (BID) | INTRAMUSCULAR | Status: DC
  Administered 2014-01-23 – 2014-01-24 (×3): 3 mL via INTRAVENOUS

## 2014-01-22 MED ORDER — ONDANSETRON HCL 4 MG/2ML IJ SOLN: 4.0000 mg | Freq: Four times a day (QID) | INTRAMUSCULAR | Status: DC | PRN

## 2014-01-22 MED ORDER — CARBIDOPA-LEVODOPA 25-100 MG PO TABS: 1.0000 | ORAL_TABLET | Freq: Every day | ORAL | Status: AC

## 2014-01-22 MED ORDER — WARFARIN SODIUM 7.5 MG PO TABS
7.5000 mg | ORAL_TABLET | Freq: Once | ORAL | Status: AC
  Administered 2014-01-22: 7.5 mg via ORAL
  Filled 2014-01-22: qty 1

## 2014-01-22 MED ORDER — HEPARIN (PORCINE) IN NACL 100-0.45 UNIT/ML-% IJ SOLN
1500.0000 [IU]/h | INTRAMUSCULAR | Status: DC
  Administered 2014-01-22 – 2014-01-23 (×2): 1500 [IU]/h via INTRAVENOUS
  Filled 2014-01-22 (×2): qty 250

## 2014-01-22 MED ORDER — WARFARIN - PHARMACIST DOSING INPATIENT: Freq: Every day | Status: DC

## 2014-01-22 MED ORDER — METFORMIN HCL 500 MG PO TABS
1000.0000 mg | ORAL_TABLET | Freq: Two times a day (BID) | ORAL | Status: DC
  Administered 2014-01-24 – 2014-01-25 (×3): 1000 mg via ORAL
  Filled 2014-01-22 (×7): qty 2

## 2014-01-22 MED ORDER — IPRATROPIUM-ALBUTEROL 0.5-2.5 (3) MG/3ML IN SOLN: 3.0000 mL | RESPIRATORY_TRACT | Status: DC | PRN

## 2014-01-22 MED ORDER — PANTOPRAZOLE SODIUM 40 MG PO TBEC
40.0000 mg | DELAYED_RELEASE_TABLET | Freq: Every day | ORAL | Status: DC
  Administered 2014-01-22 – 2014-01-25 (×4): 40 mg via ORAL
  Filled 2014-01-22 (×4): qty 1

## 2014-01-22 MED ORDER — IOHEXOL 350 MG/ML SOLN
100.0000 mL | Freq: Once | INTRAVENOUS | Status: AC | PRN
  Administered 2014-01-22: 100 mL via INTRAVENOUS

## 2014-01-22 MED ORDER — CARBIDOPA-LEVODOPA 25-100 MG PO TABS
1.0000 | ORAL_TABLET | Freq: Every day | ORAL | Status: DC
  Administered 2014-01-22 – 2014-01-25 (×15): 1 via ORAL
  Filled 2014-01-22 (×18): qty 1

## 2014-01-22 MED ORDER — HYDROCODONE-ACETAMINOPHEN 5-325 MG PO TABS: 1.0000 | ORAL_TABLET | ORAL | Status: DC | PRN

## 2014-01-22 MED ORDER — SODIUM CHLORIDE 0.9 % IV BOLUS (SEPSIS)
1000.0000 mL | Freq: Once | INTRAVENOUS | Status: AC
  Administered 2014-01-22: 1000 mL via INTRAVENOUS

## 2014-01-22 MED ORDER — HEPARIN BOLUS VIA INFUSION
4000.0000 [IU] | Freq: Once | INTRAVENOUS | Status: AC
  Administered 2014-01-22: 4000 [IU] via INTRAVENOUS
  Filled 2014-01-22: qty 4000

## 2014-01-22 MED ORDER — B COMPLEX PO TABS: 1.0000 | ORAL_TABLET | Freq: Every day | ORAL | Status: DC

## 2014-01-22 MED ORDER — SITAGLIPTIN PHOS-METFORMIN HCL 50-1000 MG PO TABS: 1.0000 | ORAL_TABLET | Freq: Two times a day (BID) | ORAL | Status: DC

## 2014-01-22 MED ORDER — ONDANSETRON HCL 4 MG PO TABS: 4.0000 mg | ORAL_TABLET | Freq: Four times a day (QID) | ORAL | Status: DC | PRN

## 2014-01-22 MED ORDER — SODIUM CHLORIDE 0.9 % IV SOLN
INTRAVENOUS | Status: AC
  Administered 2014-01-22: 21:00:00 via INTRAVENOUS

## 2014-01-22 NOTE — ED Notes (Signed)
Report called to Mike, RN.

## 2014-01-22 NOTE — Patient Instructions (Signed)
1. Increase Carbidopa Levodopa to one tablet five times daily. A new prescription has been sent to your pharmacy. 2. We will contact you about physical and speech therapy.

## 2014-01-22 NOTE — ED Provider Notes (Signed)
CSN: [id number]     Arrival date & time 01/22/14  1211 History   First MD Initiated Contact with Patient 01/22/14 1241     Chief Complaint  Patient presents with  . Tachycardia     (Consider location/radiation/quality/duration/timing/severity/associated sxs/prior Treatment) HPI comPlains of cough for the past several months. He also presents with generalized weakness for the past several weeks accompanied by generalized weakness. He denies fever. Admits to diminished appetite. No urinary symptoms. No abdominal pain. No chest pain. He feels at baseline at present. No treatment prior to coming here. He was and I are by his neurologist earlier today sent him here due to tachycardia Past Medical History  Diagnosis Date  . Diabetes mellitus   . Hyperlipidemia   . Prostate cancer   . Diverticulosis   . Internal hemorrhoids   . Adenomatous colon polyp 2004   Past Surgical History  Procedure Laterality Date  . Cholecystectomy  1980  . Total knee arthroplasty Right   . Lasik    . Back surgery  10/2012    L1-L3   History reviewed. No pertinent family history. History  Substance Use Topics  . Smoking status: Never Smoker   . Smokeless tobacco: Never Used  . Alcohol Use: Yes     Comment: very seldom    Review of Systems  Constitutional: Positive for appetite change.  HENT: Negative.   Respiratory: Positive for cough.   Cardiovascular: Negative.   Gastrointestinal: Negative.   Endocrine: Positive for polyuria.  Musculoskeletal: Negative.   Skin: Negative.   Neurological: Positive for weakness.  Psychiatric/Behavioral: Negative.   All other systems reviewed and are negative.     Allergies  Review of patient's allergies indicates no known allergies.  Home Medications   Prior to Admission medications   Medication Sig Start Date End Date Taking? Authorizing Provider  carbidopa-levodopa (SINEMET IR) 25-100 MG per tablet Take 1 tablet by mouth 5 (five) times daily. 01/22/14    Eustace Quail Tat, DO  celecoxib (CELEBREX) 200 MG capsule Take 200 mg by mouth daily.     Historical Provider, MD  esomeprazole (NEXIUM) 40 MG capsule Take 30- 60 min before your first and last meals of the day 04/14/13   Tanda Rockers, MD  sitaGLIPtan-metformin (JANUMET) 50-1000 MG per tablet Take 1 tablet by mouth 2 (two) times daily with a meal.    Historical Provider, MD   BP 138/91  Pulse 120  Temp(Src) 98.3 F (36.8 C)  Resp 18  SpO2 97% Physical Exam  Nursing note and vitals reviewed. Constitutional:  Chronically ill appearing  HENT:  Head: Normocephalic and atraumatic.  Mucous membranes dry  Eyes: Conjunctivae are normal. Pupils are equal, round, and reactive to light.  Neck: Neck supple. No tracheal deviation present. No thyromegaly present.  Cardiovascular: Exam reveals friction rub.   No murmur heard. Mildly tachycardic  Pulmonary/Chest: Effort normal and breath sounds normal.  Abdominal: Soft. Bowel sounds are normal. He exhibits no distension. There is no tenderness.  Musculoskeletal: Normal range of motion. He exhibits no edema and no tenderness.  Neurological: He is alert. Coordination normal.  Skin: Skin is warm and dry. No rash noted.  Psychiatric: He has a normal mood and affect.    ED Course  Procedures (including critical care time) Labs Review Labs Reviewed  CBC - Abnormal; Notable for the following:    RBC 3.73 (*)    Hemoglobin 12.3 (*)    HCT 36.2 (*)    RDW 16.1 (*)  Platelets 145 (*)    All other components within normal limits  BASIC METABOLIC PANEL - Abnormal; Notable for the following:    Glucose, Bld 197 (*)    GFR calc non Af Amer 77 (*)    GFR calc Af Amer 89 (*)    All other components within normal limits  PRO B NATRIURETIC PEPTIDE  D-DIMER, QUANTITATIVE  I-STAT TROPOININ, ED    Imaging Review Dg Chest 2 View  01/22/2014   CLINICAL DATA:  Fall, right-sided chest pain  EXAM: CHEST  2 VIEW  COMPARISON:  Prior chest x-ray 04/14/2013   FINDINGS: The lungs are clear and negative for focal airspace consolidation, pulmonary edema or suspicious pulmonary nodule. Stable mild central bronchitic changes. No pleural effusion or pneumothorax. Cardiac and mediastinal contours are within normal limits. Mildly ectatic and atherosclerotic thoracic aorta. Age indeterminate fracture of the lateral aspect of the right tenth rib. Stable round circumscribed density projecting over the anterior aspect of the left sixth rib. Degenerative spurring throughout the visualized spine. The visualized upper abdominal bowel gas pattern is unremarkable. Surgical clips in the right upper quadrant suggest prior cholecystectomy.  IMPRESSION: 1. Age indeterminate fracture of the lateral aspect of the right tenth rib. 2. Otherwise, no acute cardiopulmonary process. 3. Aortic atherosclerosis.   Electronically Signed   By: Jacqulynn Cadet M.D.   On: 01/22/2014 13:03     EKG Interpretation   Date/Time:  Monday January 22 2014 12:16:21 EDT Ventricular Rate:  119 PR Interval:  160 QRS Duration: 80 QT Interval:  318 QTC Calculation: 447 R Axis:   69 Text Interpretation:  Sinus tachycardia Cannot rule out Anterior infarct ,  age undetermined Abnormal ECG SINCE LAST TRACING HEART RATE HAS INCREASED  Confirmed by Winfred Leeds  MD, Maycee Blasco [phone]) on 01/22/2014 1:48:48 PM     4:15 PM patient is resting comfortably. Results for orders placed during the hospital encounter of 01/22/14  CBC      Result Value Ref Range   WBC 6.3  4.0 - 10.5 K/uL   RBC 3.73 (*) 4.22 - 5.81 MIL/uL   Hemoglobin 12.3 (*) 13.0 - 17.0 g/dL   HCT 36.2 (*) 39.0 - 52.0 %   MCV 97.1  78.0 - 100.0 fL   MCH 33.0  26.0 - 34.0 pg   MCHC 34.0  30.0 - 36.0 g/dL   RDW 16.1 (*) 11.5 - 15.5 %   Platelets 145 (*) 150 - 400 K/uL  BASIC METABOLIC PANEL      Result Value Ref Range   Sodium 139  137 - 147 mEq/L   Potassium 4.3  3.7 - 5.3 mEq/L   Chloride 101  96 - 112 mEq/L   CO2 21  19 - 32 mEq/L   Glucose,  Bld 197 (*) 70 - 99 mg/dL   BUN 23  6 - 23 mg/dL   Creatinine, Ser 0.96  0.50 - 1.35 mg/dL   Calcium 10.4  8.4 - 10.5 mg/dL   GFR calc non Af Amer 77 (*) >90 mL/min   GFR calc Af Amer 89 (*) >90 mL/min  PRO B NATRIURETIC PEPTIDE      Result Value Ref Range   Pro B Natriuretic peptide (BNP) 84.1  0 - 450 pg/mL  D-DIMER, QUANTITATIVE      Result Value Ref Range   D-Dimer, Quant 2.30 (*) 0.00 - 0.48 ug/mL-FEU  HEPATIC FUNCTION PANEL      Result Value Ref Range   Total Protein 7.9  6.0 -  8.3 g/dL   Albumin 3.7  3.5 - 5.2 g/dL   AST 15  0 - 37 U/L   ALT 19  0 - 53 U/L   Alkaline Phosphatase 112  39 - 117 U/L   Total Bilirubin 0.7  0.3 - 1.2 mg/dL   Bilirubin, Direct <0.2  0.0 - 0.3 mg/dL   Indirect Bilirubin NOT CALCULATED  0.3 - 0.9 mg/dL  I-STAT TROPOININ, ED      Result Value Ref Range   Troponin i, poc 0.00  0.00 - 0.08 ng/mL   Comment 3            Dg Chest 2 View  01/22/2014   CLINICAL DATA:  Fall, right-sided chest pain  EXAM: CHEST  2 VIEW  COMPARISON:  Prior chest x-ray 04/14/2013  FINDINGS: The lungs are clear and negative for focal airspace consolidation, pulmonary edema or suspicious pulmonary nodule. Stable mild central bronchitic changes. No pleural effusion or pneumothorax. Cardiac and mediastinal contours are within normal limits. Mildly ectatic and atherosclerotic thoracic aorta. Age indeterminate fracture of the lateral aspect of the right tenth rib. Stable round circumscribed density projecting over the anterior aspect of the left sixth rib. Degenerative spurring throughout the visualized spine. The visualized upper abdominal bowel gas pattern is unremarkable. Surgical clips in the right upper quadrant suggest prior cholecystectomy.  IMPRESSION: 1. Age indeterminate fracture of the lateral aspect of the right tenth rib. 2. Otherwise, no acute cardiopulmonary process. 3. Aortic atherosclerosis.   Electronically Signed   By: Jacqulynn Cadet M.D.   On: 01/22/2014 13:03   Ct  Angio Chest Pe W/cm &/or Wo Cm  01/22/2014   CLINICAL DATA:  Upper chest pain, shortness of breath, elevated D-dimer, past history hyperlipidemia, diabetes, prostate cancer  EXAM: CT ANGIOGRAPHY CHEST WITH CONTRAST  TECHNIQUE: Multidetector CT imaging of the chest was performed using the standard protocol during bolus administration of intravenous contrast. Multiplanar CT image reconstructions and MIPs were obtained to evaluate the vascular anatomy.  CONTRAST:  131mL OMNIPAQUE IOHEXOL 350 MG/ML SOLN  COMPARISON:  None  FINDINGS: Atherosclerotic calcifications aorta and coronary arteries.  No aortic aneurysm or dissection.  BILATERAL pulmonary arterial filling defects consistent with pulmonary embolism.  These include saddle emboli at the bifurcations of the RIGHT and LEFT pulmonary arteries, extending into BILATERAL lower lobes, extending less into BILATERAL upper lobes and RIGHT middle lobe.  Elevated RV:LV ratio = 1.61.  No thoracic adenopathy.  Post cholecystectomy.  Visualized portion of upper abdomen otherwise unremarkable.  Dependent atelectasis RIGHT lower lobe.  Multiple poorly defined pulmonary nodules, majority in RIGHT lung, many of which appear potentially subsolid, largest 17 x 15 mm, could represent infection or tumor.  No segmental consolidation, pleural effusion or pneumothorax.  Scattered degenerative disc disease changes thoracic spine.  Review of the MIP images confirms the above findings.  IMPRESSION: Large BILATERAL pulmonary emboli.  Positive for acute PE with CT evidence of right heart strain (RV/LV Ratio = 1.61) consistent with at least submassive (intermediate risk) PE. The presence of right heart strain has been associated with an increased risk of morbidity and mortality. Consultation with Pulmonary and Critical Care Medicine is recommended.  BILATERAL pulmonary nodules/masses, majority in RIGHT lung, largest 17 x 15 mm, many of which are potentially subsolid ; these could be the result of  infection or tumor in this patient with a history of prostate cancer.  Critical Value/emergent results were called by telephone at the time of interpretation on 01/22/2014 at 1600  hrs to Dr. Stark Jock and at 1614 hrs to Dr. Revonda Humphrey, who verbally acknowledged these results.   Electronically Signed   By: Lavonia Dana M.D.   On: 01/22/2014 16:15   Chest xray viewed by me MDM  CT scan discussed with Dr.Bowles, radiologist. Radiologic standpoint patient has no contraindication to anticoagulation. Case discussed with Dr.Devine Plan admit to telemetry Heparin per pharmacy protocol Coumadin per pharmacy protocol Final diagnoses:  None   Diagnosis #1 acute pulmonary embolism #2 hyperglycemia #3 pulmonary nodules    Orlie Dakin, MD 01/22/14 [id number]

## 2014-01-22 NOTE — Progress Notes (Signed)
Andrew [name] was seen today in the movement disorders clinic for neurologic consultation at the request of Dr. Forde Dandy.  The consultation is for the evaluation of tremor and to r/o PD.  This patient is accompanied in the office by his spouse who supplements the history.   The first symptom(s) the patient noticed was tremor in the R pointer finger.  It started 3 months ago.  It was at rest.  He went to a neurologist in [place], [place].  He was not given a dx but was started on primidone.  He takes 50 mg tid and it seems to work.  His wife is not convinced that it really has helped, but the patient thinks that it does.  The patient underwent back surgery in [place] in March.  He states that the weakness and slowness got much worse after that.  He was very slow to recover.  His wife states that he was "zombie like."  He previously was very active with golfing and could not exercise.  He had no physical therapy.  Over the last month or so, he has gotten much stronger.  His wife actually thought he was so bad that he had a stroke and she took him to the hospital and a CT was performed that was unremarkable, with the exception of atrophy.  05/11/13 update:  The patient is following up today regarding his parkinsonism.  He was started on carbidopa/levodopa last visit and thinks that it has been somewhat helpful.  He remains on primidone, and thinks that it helps the tremor significantly, so does not want to go off of that and asks for a refill.  The patient is getting ready to return to [place], [place] where he lives most of the year.  He will be gone from October until next June.  He has had no falls.  He has had no hallucinations.  He admits to tremor.  He has had no dizziness or lightheadedness.  He continues to struggle with chronic back pain.  He continues to play golf, but states that his golf game is not as good as it used to be.  01/22/13 update:  The patient is following up today regarding his parkinsonism.   He has not been seen since September of last year, as he lives in [place] of the large majority of the year.  He is accompanied by his wife who supplements the history. He is on carbidopa/levodopa 25/100, one tablet 3 times a day (5am/noon/7pm) but his PCP in [place] took him off of the primidone.   Tremor has been increasing.  When he first went on the carbidopa/levodopa, his thinking and tremor and weakness and facial masking were improved but now all of those have deteriorated and he has noted tremor on the opposite side now as well.  One fall; slipped on a wooden floor.  His voice has been weaker.  He has been more difficult to understand.  No hallucinations.  His wife thinks that he is not thinking as clearly as in the past, but it may have improved somewhat going off the primidone.    PREVIOUS MEDICATIONS: none to date  ALLERGIES:  No Known Allergies  CURRENT MEDICATIONS:    Medication List       This list is accurate as of: 01/22/14 11:18 AM.  Always use your most recent med list.               carbidopa-levodopa 25-100 MG per tablet  Commonly known as:  SINEMET IR  Take 1 tablet by mouth 3 (three) times daily.     celecoxib 200 MG capsule  Commonly known as:  CELEBREX  Take 200 mg by mouth daily.     esomeprazole 40 MG capsule  Commonly known as:  NEXIUM  Take 30- 60 min before your first and last meals of the day     sitaGLIPtin-metformin 50-1000 MG per tablet  Commonly known as:  JANUMET  Take 1 tablet by mouth 2 (two) times daily with a meal.        PAST MEDICAL HISTORY:   Past Medical History  Diagnosis Date  . Diabetes mellitus   . Hyperlipidemia   . Prostate cancer   . Diverticulosis   . Internal hemorrhoids   . Adenomatous colon polyp 2004    PAST SURGICAL HISTORY:   Past Surgical History  Procedure Laterality Date  . Cholecystectomy  1980  . Total knee arthroplasty Right   . Lasik    . Back surgery  10/2012    L1-L3    SOCIAL HISTORY:   History    Social History  . Marital Status: Married    Spouse Name: N/A    Number of Children: N/A  . Years of Education: N/A   Occupational History  . retired     cross country trucking   Social History Main Topics  . Smoking status: Never Smoker   . Smokeless tobacco: Never Used  . Alcohol Use: Yes     Comment: very seldom  . Drug Use: Not on file  . Sexual Activity: Not on file   Other Topics Concern  . Not on file   Social History Narrative  . No narrative on file    FAMILY HISTORY:   Family Status  Relation Status Death Age  . Mother Deceased     CA, throat  . Father Deceased 74  . Brother Deceased     2, one in [place]; one old age  . Sister Deceased 47  . Sister Deceased     "old age"  . Sister Alive     2, DM-2, hyperlipidemia, CAD  . Child Alive     2, one with hx of breast CA    ROS:  Has noted increasing shortness of breath lately.  A complete 10 system review of systems was obtained and was unremarkable apart from what is mentioned above.  PHYSICAL EXAMINATION:    VITALS:   Filed Vitals:   01/22/14 1113  BP: 118/54  Pulse: 122  Height: 6' (1.829 m)  Weight: 183 lb (83.008 kg)   O2 saturation: 91%  GEN:  The patient appears stated age and is in NAD. HEENT:  Normocephalic, atraumatic.  The mucous membranes are moist. The superficial temporal arteries are without ropiness or tenderness. CV:  Tachycardic, but regular rhythm Lungs:  CTAB  Neck/HEME:  There are no carotid bruits bilaterally.  Neurological examination:  Orientation: The patient is alert and oriented x3. Fund of knowledge is appropriate.  Recent and remote memory are intact.  Attention and concentration are normal.    Able to name objects and repeat phrases. Cranial nerves: There is good facial symmetry.  There is significant facial hypomimia. Pupils are equal round and reactive to light bilaterally. Fundoscopic exam reveals clear margins bilaterally. Extraocular muscles are intact but there  may be slight downgaze paresis. The visual fields are full to confrontational testing. The speech is hypophonic, dysarthric and he has difficulty with the guttural sounds.  It is fluent. Soft palate rises symmetrically and there is no tongue deviation. Hearing is intact to conversational tone. Motor: Strength is 5/5 in the bilateral upper and lower extremities.   Shoulder shrug is equal and symmetric.  There is no pronator drift.   Movement examination: Tone: There is moderate rigidity in the left upper extremity.  Tone in the right upper extremity is normal. Abnormal movements: There is a bilateral upper extremity resting tremor.  This is overall mild and intermittent bilaterally. Coordination:  There is definite decremation with RAM's, seen bilaterally and with all forms of rapid alternating movements. Gait and Station: The patient has no difficulty arising out of a deep-seated chair without the use of the hands. The patient's stride length is decreased.  He drags the left foot.  ASSESSMENT/PLAN:  1.  Parkinsonism.  The differential diagnosis lies between idiopathic Parkinson's disease and PSP.  I continue to favor PSP, as he definitely had a pseudobulbar affect (laughter) and he does have diplopia and mild downgaze paresis.  He does have a little tremor, which goes against PSP, but otherwise he has symptoms more consistent with a tauopathy than alpha synucleinopathy.  Time will certainly reveal this to [place] and tx is really the same, although prognosis is different.  -I am going to continue him on carbidopa/levodopa 25/100 but I am going to increase this to 5 times per day. Risks, benefits, side effects and alternative therapies were discussed.  The opportunity to ask questions was given and they were answered to the best of my ability.  The patient expressed understanding and willingness to follow the outlined treatment protocols.  -I. agree with the discontinuation of the primidone.  I think that  this was affecting his cognition.  -will coordinate PT/ST with [facility] hospital in [place] near where pt lives.  -discussed importance of exercise.  Pt education provided. 2.  tachycardia, with mild hypoxia.  -I called his primary care physician, as the patient had an appointment with his primary care physician immediately after me.  We agreed to just send him to the emergency room.  I subsequently called the emergency room physician and gave him a heads up that the patient would be arriving shortly.  Greater than 50% of 40 min visit in counseling and coordinating care.

## 2014-01-22 NOTE — H&P (Addendum)
[facility] History and Physical  Andrew [name] PPJ:[id number] DOB: 11-01-1931 DOA: 01/22/2014  Referring physician: ER physician PCP:  Melinda Crutch, MD   Chief Complaint: shortness of breath  HPI:  80 year old male with past medical history of parkinson's disease who presented to [facility] ED 01/22/2014 with progressive generalized weakness, recent treatment of bronchitis. Pt reported not feeling significantly better even with completion of antibiotics. He had seen his PCP in the office and was found to have tachycardia and hypoxia although he was not hypoxic in ED. He was sent to ED for further evaluation. He feels better at this time. He has no complaints of chest pian, fever or chills. No palpitations. He had small amount of productive cough in ED with streaks of blood. No hematemesis. No lightheadedness or loss of consciousness. In ED, BP was 118/54, HR 81-122, T max 98.9 F and oxygen saturation 93% on room air. His 12 lead EKG showed sinus tachycardia. His CT angio chest was positive for pulmonary embolism. Also seen were multiple nodules concerning for tumor versus infection. He was started on coumadin and heparin for anticoagulation.   Assessment & Plan    Active Problems:   Pulmonary embolism - started on heparin and coumadin    Pulmonary nodules  - seen on CT angio chest - asked pulmonary for consultation if any of the nodules are amenable to biopsy; they will see the pt in am   Parkinson's disease - continue sinemet     Diabetes - continue Janumet   DVT prophylaxis: full anticoagulation with heparin and coumadin   Radiological Exams on Admission: Dg Chest 2 View 01/22/2014    IMPRESSION: 1. Age indeterminate fracture of the lateral aspect of the right tenth rib. 2. Otherwise, no acute cardiopulmonary process. 3. Aortic atherosclerosis.     Ct Angio Chest Pe W/cm &/or Wo Cm 01/22/2014  IMPRESSION: Large BILATERAL pulmonary emboli.  Positive for acute PE with CT evidence of right  heart strain (RV/LV Ratio = 1.61) consistent with at least submassive (intermediate risk) PE. The presence of right heart strain has been associated with an increased risk of morbidity and mortality. Consultation with Pulmonary and Critical Care Medicine is recommended.  BILATERAL pulmonary nodules/masses, majority in RIGHT lung, largest 17 x 15 mm, many of which are potentially subsolid ; these could be the result of infection or tumor in this patient with a history of prostate cancer.       EKG: sinus tachycardia  Code Status: Full Family Communication: Plan of care discussed with the patient  Disposition Plan: Admit for further evaluation  Robbie Lis, MD  [facility] Pager [phone]  Review of Systems:  Constitutional: Negative for fever, chills and positive for malaise/fatigue. Negative for diaphoresis.  HENT: Negative for hearing loss, ear pain, nosebleeds, congestion, sore throat, neck pain, tinnitus and ear discharge.   Eyes: Negative for blurred vision, double vision, photophobia, pain, discharge and redness.  Respiratory: Positive for cough and shortness of breath, no wheezing and stridor.   Cardiovascular: Negative for chest pain, palpitations, orthopnea, claudication and leg swelling.  Gastrointestinal: Negative for nausea, vomiting and abdominal pain. Negative for heartburn, constipation, blood in stool and melena.  Genitourinary: Negative for dysuria, urgency, frequency, hematuria and flank pain.  Musculoskeletal: Negative for myalgias, back pain, joint pain and falls.  Skin: Negative for itching and rash.  Neurological: Negative for dizziness and positive for weakness. Negative for tingling, tremors, sensory change, speech change, focal weakness, loss of consciousness and  headaches.  Endo/Heme/Allergies: Negative for environmental allergies and polydipsia. Does not bruise/bleed easily.  Psychiatric/Behavioral: Negative for suicidal ideas. The patient is not  nervous/anxious.      Past Medical History  Diagnosis Date  . Diabetes mellitus   . Hyperlipidemia   . Prostate cancer   . Diverticulosis   . Internal hemorrhoids   . Adenomatous colon polyp 2004   Past Surgical History  Procedure Laterality Date  . Cholecystectomy  1980  . Total knee arthroplasty Right   . Lasik    . Back surgery  10/2012    L1-L3   Social History:  reports that he has never smoked. He has never used smokeless tobacco. He reports that he drinks alcohol. His drug history is not on file.  Allergies  Allergen Reactions  . Sulfa Antibiotics Hives    Family History: Family medical history significant for HTN, HLD   Prior to Admission medications   Medication Sig Start Date End Date Taking? Authorizing Provider  b complex vitamins tablet Take 1 tablet by mouth daily.   Yes Historical Provider, MD  carbidopa-levodopa (SINEMET IR) 25-100 MG per tablet Take 1 tablet by mouth 5 (five) times daily. 01/22/14  Yes Rebecca S Tat, DO  celecoxib (CELEBREX) 200 MG capsule Take 200 mg by mouth daily.    Yes Historical Provider, MD  esomeprazole (NEXIUM) 40 MG capsule Take 30- 60 min before your first and last meals of the day 04/14/13  Yes Tanda Rockers, MD  naproxen sodium (ANAPROX) 220 MG tablet Take 220-660 mg by mouth 2 (two) times daily as needed (pain).   Yes Historical Provider, MD  sitaGLIPtan-metformin (JANUMET) 50-1000 MG per tablet Take 1 tablet by mouth 2 (two) times daily with a meal.   Yes Historical Provider, MD  VITAMIN D, CHOLECALCIFEROL, PO Take 1 capsule by mouth daily.   Yes Historical Provider, MD   Physical Exam: Filed Vitals:   01/22/14 1330 01/22/14 1400 01/22/14 1430 01/22/14 1500  BP: 129/92 137/84 122/71   Pulse: 85 83 81   Temp:      Resp: 27 19 26    Height:    6' 0.05" (1.83 m)  Weight:    83 kg (182 lb 15.7 oz)  SpO2: 98% 99% 93%     Physical Exam  Constitutional: Appears well-developed and well-nourished. No distress.  HENT:  Normocephalic. No tonsillar erythema or exudates Eyes: Conjunctivae and EOM are normal. PERRLA, no scleral icterus.  Neck: Normal ROM. Neck supple. No JVD. No tracheal deviation. CVS: tachycardic, S1/S2 appreciated  Pulmonary: Effort and breath sounds normal, no wheezing Abdominal: Soft. BS +,  no distension, tenderness, rebound or guarding.  Musculoskeletal: Normal range of motion. No edema and no tenderness.  Lymphadenopathy: No lymphadenopathy noted, cervical, inguinal. Neuro: Alert. No focal neurologic deficits Skin: Skin is warm and dry. No rash noted.   Psychiatric: Normal mood and affect. Behavior, judgment, thought content normal.   Labs on Admission:  Basic Metabolic Panel:  Recent Labs Lab 01/22/14 1218  NA 139  K 4.3  CL 101  CO2 21  GLUCOSE 197*  BUN 23  CREATININE 0.96  CALCIUM 10.4   Liver Function Tests:  Recent Labs Lab 01/22/14 1438  AST 15  ALT 19  ALKPHOS 112  BILITOT 0.7  PROT 7.9  ALBUMIN 3.7   No results found for this basename: LIPASE, AMYLASE,  in the last 168 hours No results found for this basename: AMMONIA,  in the last 168 hours CBC:  Recent Labs Lab 01/22/14 1218  WBC 6.3  HGB 12.3*  HCT 36.2*  MCV 97.1  PLT 145*   Cardiac Enzymes: No results found for this basename: CKTOTAL, CKMB, CKMBINDEX, TROPONINI,  in the last 168 hours BNP: No components found with this basename: POCBNP,  CBG: No results found for this basename: GLUCAP,  in the last 168 hours  If 7PM-7AM, please contact night-coverage [email or web address] Password [facility] 01/22/2014, 4:41 PM

## 2014-01-22 NOTE — Telephone Encounter (Signed)
Message copied by Annamaria Helling on Mon Jan 22, 2014  1:13 PM ------      Message from: Blenda Peals      Created: Mon Jan 22, 2014  1:07 PM      Regarding: [name] from [place]: [phone]       [name] from [place] wanting to f/u regarding pt's script. Please call him back when you get a chance at [phone] ------

## 2014-01-22 NOTE — Consult Note (Signed)
ANTICOAGULATION CONSULT NOTE - Initial Consult  Pharmacy Consult for Heparin and Coumadin Indication: pulmonary embolus  Allergies  Allergen Reactions  . Sulfa Antibiotics Hives    Patient Measurements: Height: 6' 0.05" (183 cm) Weight: 182 lb 15.7 oz (83 kg) IBW/kg (Calculated) : 77.71 Heparin Dosing Weight: 83kg  Vital Signs: Temp: 98.3 F (36.8 C) (06/01 1218) BP: 122/71 mmHg (06/01 1430) Pulse Rate: 81 (06/01 1430)  Labs:  Recent Labs  01/22/14 1218  HGB 12.3*  HCT 36.2*  PLT 145*  CREATININE 0.96    Estimated Creatinine Clearance: 68.6 ml/min (by C-G formula based on Cr of 0.96).   Medical History: Past Medical History  Diagnosis Date  . Diabetes mellitus   . Hyperlipidemia   . Prostate cancer   . Diverticulosis   . Internal hemorrhoids   . Adenomatous colon polyp 2004    Medications:  No anticoagulants pta  Assessment: 81yom presents to the ED with tachycardia as well as generalized weakness for the past several weeks. D-dimer elevated. CT angio shows large bilateral PE with right heart strain. He will begin IV heparin and coumadin. Baseline renal function and CBC ok. Coumadin predictor score = 5.   He will need a minimum 5 day overlap with heparin and coumadin.  Goal of Therapy:  INR 2-3 Heparin level 0.3-0.7 units/ml Monitor platelets by anticoagulation protocol: Yes   Plan:  1) Heparin bolus 4000 units x 1 2) Heparin drip at 1500 units/hr 3) Coumadin 7.5mg  x 1 4) Check 8 hour heparin level 5) Daily heparin level, INR, CBC 6) Will need coumadin education - book/video  Benjamine Sprague [facility] 01/22/2014,4:34 PM

## 2014-01-22 NOTE — Consult Note (Signed)
Name: Andrew [name] MRN: [id number] DOB: 02-03-1932    ADMISSION DATE:  01/22/2014 CONSULTATION DATE:  01/22/2014  REFERRING MD :  Charlies Silvers PRIMARY SERVICE:  [facility]  CHIEF COMPLAINT:  sob   HISTORY OF PRESENT ILLNESS:  80 year old never smoker with  debilitating Parkinson's disease presented with generalized weakness, fatigue and dyspnea. He was at the neurologist office, and noted to be tachycardic and hypoxic and hence sent to the emergency room. D-dimer was positive . CT angiogram showed large bilateral pulmonary emboli with CT evidence of right heart strain (RV/LV ratio was 1.6). Incidentally, multiple pulmonary nodules were noted which showed in the right lung, some of which or substernal and, the largest measured 17x15 mm. Hence we're consulted. He lives in [place] for the majority of the year. He is chronic back pain. His movement is limited and he can hardly walk a block now.  He reports dysphagia to solids more than liquids. He is lost about 20 pounds over the last month.  Neurology advised him to increase carbidopa/levodopa to 5 times a day He has a remote history of prostate cancer   PAST MEDICAL HISTORY :  Past Medical History  Diagnosis Date  . Diabetes mellitus   . Hyperlipidemia   . Prostate cancer   . Diverticulosis   . Internal hemorrhoids   . Adenomatous colon polyp 2004   Past Surgical History  Procedure Laterality Date  . Cholecystectomy  1980  . Total knee arthroplasty Right   . Lasik    . Back surgery  10/2012    L1-L3   Prior to Admission medications   Medication Sig Start Date End Date Taking? Authorizing Provider  b complex vitamins tablet Take 1 tablet by mouth daily.   Yes Historical Provider, MD  carbidopa-levodopa (SINEMET IR) 25-100 MG per tablet Take 1 tablet by mouth 5 (five) times daily. 01/22/14  Yes Rebecca S Tat, DO  celecoxib (CELEBREX) 200 MG capsule Take 200 mg by mouth daily.    Yes Historical Provider, MD  esomeprazole (NEXIUM) 40 MG  capsule Take 30- 60 min before your first and last meals of the day 04/14/13  Yes Tanda Rockers, MD  naproxen sodium (ANAPROX) 220 MG tablet Take 220-660 mg by mouth 2 (two) times daily as needed (pain).   Yes Historical Provider, MD  sitaGLIPtan-metformin (JANUMET) 50-1000 MG per tablet Take 1 tablet by mouth 2 (two) times daily with a meal.   Yes Historical Provider, MD  VITAMIN D, CHOLECALCIFEROL, PO Take 1 capsule by mouth daily.   Yes Historical Provider, MD   Allergies  Allergen Reactions  . Sulfa Antibiotics Hives    FAMILY HISTORY:  History reviewed. No pertinent family history. SOCIAL HISTORY:  reports that he has never smoked. He has never used smokeless tobacco. He reports that he drinks alcohol. His drug history is not on file.  REVIEW OF SYSTEMS:   Constitutional: Negative for fever, chills, weight loss, malaise/fatigue and diaphoresis.  HENT: Negative for hearing loss, ear pain, nosebleeds, congestion, sore throat, neck pain, tinnitus and ear discharge.   Eyes: Negative for blurred vision, double vision, photophobia, pain, discharge and redness.  Respiratory: Negative for cough, hemoptysis, sputum production,  wheezing and stridor.   Cardiovascular: Negative for chest pain, palpitations, orthopnea, claudication, leg swelling and PND.  Gastrointestinal: Negative for heartburn, nausea, vomiting, abdominal pain, diarrhea, constipation, blood in stool and melena.  Genitourinary: Negative for dysuria, urgency, frequency, hematuria and flank pain.  Musculoskeletal: Negative for myalgias, back pain, joint  pain and falls.  Skin: Negative for itching and rash.  Neurological: Negative for dizziness, tingling, tremors, sensory change, speech change, focal weakness, seizures, loss of consciousness, weakness and headaches.  Endo/Heme/Allergies: Negative for environmental allergies and polydipsia. Does not bruise/bleed easily.  SUBJECTIVE:   VITAL SIGNS: Temp:  [97.2 F (36.2 C)-98.9  F (37.2 C)] 98.7 F (37.1 C) (06/01 2006) Pulse Rate:  [81-122] 91 (06/01 2006) Resp:  [18-27] 20 (06/01 2006) BP: (118-148)/(54-92) 148/68 mmHg (06/01 2006) SpO2:  [92 %-99 %] 95 % (06/01 2006) Weight:  [181 lb 7 oz (82.3 kg)-183 lb (83.008 kg)] 181 lb 7 oz (82.3 kg) (06/01 1804)  PHYSICAL EXAMINATION: Gen. Pleasant, well-nourished, in no distress, flat affect ENT - no lesions, no post nasal drip Neck: No JVD, no thyromegaly, no carotid bruits Lungs: no use of accessory muscles, no dullness to percussion, clear without rales or rhonchi  Cardiovascular: Rhythm regular, heart sounds  normal, no murmurs, no peripheral edema Abdomen: soft and non-tender, no hepatosplenomegaly, BS normal. Musculoskeletal: No deformities, no cyanosis or clubbing Neuro:  alert, non focal, tremors+, cog wheel rigidity + Skin:  Warm, no lesions/ rash    Recent Labs Lab 01/22/14 1218  NA 139  K 4.3  CL 101  CO2 21  BUN 23  CREATININE 0.96  GLUCOSE 197*    Recent Labs Lab 01/22/14 1218  HGB 12.3*  HCT 36.2*  WBC 6.3  PLT 145*   Dg Chest 2 View  01/22/2014   CLINICAL DATA:  Fall, right-sided chest pain  EXAM: CHEST  2 VIEW  COMPARISON:  Prior chest x-ray 04/14/2013  FINDINGS: The lungs are clear and negative for focal airspace consolidation, pulmonary edema or suspicious pulmonary nodule. Stable mild central bronchitic changes. No pleural effusion or pneumothorax. Cardiac and mediastinal contours are within normal limits. Mildly ectatic and atherosclerotic thoracic aorta. Age indeterminate fracture of the lateral aspect of the right tenth rib. Stable round circumscribed density projecting over the anterior aspect of the left sixth rib. Degenerative spurring throughout the visualized spine. The visualized upper abdominal bowel gas pattern is unremarkable. Surgical clips in the right upper quadrant suggest prior cholecystectomy.  IMPRESSION: 1. Age indeterminate fracture of the lateral aspect of the  right tenth rib. 2. Otherwise, no acute cardiopulmonary process. 3. Aortic atherosclerosis.   Electronically Signed   By: Jacqulynn Cadet M.D.   On: 01/22/2014 13:03   Ct Angio Chest Pe W/cm &/or Wo Cm  01/22/2014   CLINICAL DATA:  Upper chest pain, shortness of breath, elevated D-dimer, past history hyperlipidemia, diabetes, prostate cancer  EXAM: CT ANGIOGRAPHY CHEST WITH CONTRAST  TECHNIQUE: Multidetector CT imaging of the chest was performed using the standard protocol during bolus administration of intravenous contrast. Multiplanar CT image reconstructions and MIPs were obtained to evaluate the vascular anatomy.  CONTRAST:  127mL OMNIPAQUE IOHEXOL 350 MG/ML SOLN  COMPARISON:  None  FINDINGS: Atherosclerotic calcifications aorta and coronary arteries.  No aortic aneurysm or dissection.  BILATERAL pulmonary arterial filling defects consistent with pulmonary embolism.  These include saddle emboli at the bifurcations of the RIGHT and LEFT pulmonary arteries, extending into BILATERAL lower lobes, extending less into BILATERAL upper lobes and RIGHT middle lobe.  Elevated RV:LV ratio = 1.61.  No thoracic adenopathy.  Post cholecystectomy.  Visualized portion of upper abdomen otherwise unremarkable.  Dependent atelectasis RIGHT lower lobe.  Multiple poorly defined pulmonary nodules, majority in RIGHT lung, many of which appear potentially subsolid, largest 17 x 15 mm, could represent infection or  tumor.  No segmental consolidation, pleural effusion or pneumothorax.  Scattered degenerative disc disease changes thoracic spine.  Review of the MIP images confirms the above findings.  IMPRESSION: Large BILATERAL pulmonary emboli.  Positive for acute PE with CT evidence of right heart strain (RV/LV Ratio = 1.61) consistent with at least submassive (intermediate risk) PE. The presence of right heart strain has been associated with an increased risk of morbidity and mortality. Consultation with Pulmonary and Critical Care  Medicine is recommended.  BILATERAL pulmonary nodules/masses, majority in RIGHT lung, largest 17 x 15 mm, many of which are potentially subsolid ; these could be the result of infection or tumor in this patient with a history of prostate cancer.  Critical Value/emergent results were called by telephone at the time of interpretation on 01/22/2014 at 1600 hrs to Dr. Stark Jock and at 1614 hrs to Dr. Revonda Humphrey, who verbally acknowledged these results.   Electronically Signed   By: Lavonia Dana M.D.   On: 01/22/2014 16:15    ASSESSMENT / PLAN:  Submassive PE - CT evidence of RV strain -Continue IV heparin -Check venous Doppler for completion -If hemodynamically stable for 24 hours, can change to Lovenox and initiate oral anticoagulation (Coumadin versus novel agent) -No indication for thrombolytic at this time  Multiple pulmonary nodules -Unclear etiology -Infection is a possibility, socially given groundglass nature of some of these nodules, metastatic malignancy remains in the differential -Consider CT abdomen, given his unexplained weight loss-he does report negative colonoscopy in [place], we'll obtain some more of this history from his PCP, Dr Forde Dandy -Age appropriate screening for malignancy including PSA -If no clear etiology would consider repeat CT imaging in 6-8 weeks -These nodules are not amenable to biopsy at this time and treatment of embolism takes precedence   Kara Mead MD. FCCP. [facility] Pager [phone] If no response call [phone]    01/22/2014, 10:34 PM

## 2014-01-22 NOTE — ED Notes (Signed)
Pt sts that he is feeling much better.

## 2014-01-22 NOTE — Telephone Encounter (Signed)
Wanted to verify the dosage change to the Levodopa. Aware we increased to 5 times daily.

## 2014-01-22 NOTE — ED Notes (Signed)
Pt sent here from [facility] with increased heart rate. Denies chest pain. sts SOB.

## 2014-01-22 NOTE — ED Notes (Signed)
Pt reports he went to his PCP for an follow up appointment for his parkinson's, sts while he was there his HR was in 120s then shot up to 190s so they sent him here for further evaluation. Denies cp. sts he has been feeling sob x 2-3 days and a productive cough. sts he was dx with bronchitis within the past month. Just finished antibiotics about 2 weeks ago, but hasn't felt any better. Fell 2 weeks ago and landed on his back and increased his chronic back pain, had a recent MRI performed showing disc problems and suggested sx for disc fusion. Nad, skin warm and dry, resp e/u.

## 2014-01-23 DIAGNOSIS — I517 Cardiomegaly: Secondary | ICD-10-CM

## 2014-01-23 LAB — HEPARIN LEVEL (UNFRACTIONATED)
HEPARIN UNFRACTIONATED: 1.03 [IU]/mL — AB (ref 0.30–0.70)
Heparin Unfractionated: 0.34 IU/mL (ref 0.30–0.70)
Heparin Unfractionated: 1.72 IU/mL — ABNORMAL HIGH (ref 0.30–0.70)

## 2014-01-23 LAB — COMPREHENSIVE METABOLIC PANEL
ALK PHOS: 102 U/L (ref 39–117)
ALT: <5 U/L (ref 0–53)
AST: 11 U/L (ref 0–37)
Albumin: 3.1 g/dL — ABNORMAL LOW (ref 3.5–5.2)
BILIRUBIN TOTAL: 0.4 mg/dL (ref 0.3–1.2)
BUN: 19 mg/dL (ref 6–23)
CHLORIDE: 102 meq/L (ref 96–112)
CO2: 24 meq/L (ref 19–32)
CREATININE: 0.86 mg/dL (ref 0.50–1.35)
Calcium: 9.5 mg/dL (ref 8.4–10.5)
GFR calc Af Amer: >90 mL/min (ref >90)
GFR, EST NON AFRICAN AMERICAN: 80 mL/min — AB (ref >90)
Glucose, Bld: 191 mg/dL — ABNORMAL HIGH (ref 70–99)
POTASSIUM: 4 meq/L (ref 3.7–5.3)
Sodium: 139 mEq/L (ref 137–147)
Total Protein: 7 g/dL (ref 6.0–8.3)

## 2014-01-23 LAB — TROPONIN I: Troponin I: <0.3 ng/mL (ref <0.30)

## 2014-01-23 LAB — CBC
HEMATOCRIT: 33.9 % — AB (ref 39.0–52.0)
Hemoglobin: 11.3 g/dL — ABNORMAL LOW (ref 13.0–17.0)
MCH: 32.8 pg (ref 26.0–34.0)
MCHC: 33.3 g/dL (ref 30.0–36.0)
MCV: 98.3 fL (ref 78.0–100.0)
PLATELETS: 134 10*3/uL — AB (ref 150–400)
RBC: 3.45 MIL/uL — AB (ref 4.22–5.81)
RDW: 16.2 % — ABNORMAL HIGH (ref 11.5–15.5)
WBC: 5.8 10*3/uL (ref 4.0–10.5)

## 2014-01-23 LAB — PRO B NATRIURETIC PEPTIDE: Pro B Natriuretic peptide (BNP): 81.6 pg/mL (ref 0–450)

## 2014-01-23 LAB — PROTIME-INR
INR: 1.1 (ref 0.00–1.49)
PROTHROMBIN TIME: 14 s (ref 11.6–15.2)

## 2014-01-23 LAB — GLUCOSE, CAPILLARY
GLUCOSE-CAPILLARY: 195 mg/dL — AB (ref 70–99)
Glucose-Capillary: 214 mg/dL — ABNORMAL HIGH (ref 70–99)

## 2014-01-23 MED ORDER — ENOXAPARIN SODIUM 80 MG/0.8ML ~~LOC~~ SOLN
80.0000 mg | Freq: Once | SUBCUTANEOUS | Status: AC
  Administered 2014-01-23: 80 mg via SUBCUTANEOUS
  Filled 2014-01-23: qty 0.8

## 2014-01-23 MED ORDER — ENOXAPARIN SODIUM 80 MG/0.8ML ~~LOC~~ SOLN
80.0000 mg | Freq: Two times a day (BID) | SUBCUTANEOUS | Status: DC
  Administered 2014-01-23 – 2014-01-24 (×2): 80 mg via SUBCUTANEOUS
  Filled 2014-01-23 (×3): qty 0.8

## 2014-01-23 MED ORDER — COUMADIN BOOK
Freq: Once | Status: AC
  Administered 2014-01-23: 12:00:00
  Filled 2014-01-23: qty 1

## 2014-01-23 MED ORDER — INSULIN ASPART 100 UNIT/ML ~~LOC~~ SOLN
0.0000 [IU] | Freq: Three times a day (TID) | SUBCUTANEOUS | Status: DC
  Administered 2014-01-23: 5 [IU] via SUBCUTANEOUS
  Administered 2014-01-24: 2 [IU] via SUBCUTANEOUS
  Administered 2014-01-24: 5 [IU] via SUBCUTANEOUS
  Administered 2014-01-24: 2 [IU] via SUBCUTANEOUS
  Administered 2014-01-25: 3 [IU] via SUBCUTANEOUS
  Administered 2014-01-25: 5 [IU] via SUBCUTANEOUS

## 2014-01-23 MED ORDER — WARFARIN SODIUM 7.5 MG PO TABS
7.5000 mg | ORAL_TABLET | Freq: Once | ORAL | Status: DC
  Filled 2014-01-23: qty 1

## 2014-01-23 MED ORDER — INSULIN ASPART 100 UNIT/ML ~~LOC~~ SOLN
0.0000 [IU] | Freq: Every day | SUBCUTANEOUS | Status: DC
Start: 2014-01-23 — End: 2014-01-25

## 2014-01-23 MED ORDER — WARFARIN VIDEO
Freq: Once | Status: AC
  Administered 2014-01-23: 12:00:00

## 2014-01-23 NOTE — Discharge Instructions (Addendum)
Information on my medicine - ELIQUIS (apixaban)  This medication education was reviewed with me or my healthcare representative as part of my discharge preparation.  The pharmacist that spoke with me during my hospital stay was:  Verna Czech, [place]  Why was Eliquis prescribed for you? Eliquis was prescribed to treat blood clots that may have been found in the veins of your legs (deep vein thrombosis) or in your lungs (pulmonary embolism) and to reduce the risk of them occurring again.  What do You need to know about Eliquis ? The starting dose is 10 mg (two 5 mg tablets) taken TWICE daily for the FIRST SEVEN (7) DAYS, then on 6/11  the dose is reduced to ONE 5 mg tablet taken TWICE daily.  Eliquis may be taken with or without food.   Try to take the dose about the same time in the morning and in the evening. If you have difficulty swallowing the tablet whole please discuss with your pharmacist how to take the medication safely.  Take Eliquis exactly as prescribed and DO NOT stop taking Eliquis without talking to the doctor who prescribed the medication.  Stopping may increase your risk of developing a new blood clot.  Refill your prescription before you run out.  After discharge, you should have regular check-up appointments with your healthcare provider that is prescribing your Eliquis.    What do you do if you miss a dose? If a dose of ELIQUIS is not taken at the scheduled time, take it as soon as possible on the same day and twice-daily administration should be resumed. The dose should not be doubled to make up for a missed dose.  Important Safety Information A possible side effect of Eliquis is bleeding. You should call your healthcare provider right away if you experience any of the following:   Bleeding from an injury or your nose that does not stop.   Unusual colored urine (red or dark brown) or unusual colored stools (red or black).   Unusual bruising for unknown reasons.   A serious fall or if you hit your head (even if there is no bleeding).  Some medicines may interact with Eliquis and might increase your risk of bleeding or clotting while on Eliquis. To help avoid this, consult your healthcare provider or pharmacist prior to using any new prescription or non-prescription medications, including herbals, vitamins, non-steroidal anti-inflammatory drugs (NSAIDs) and supplements.  This website has more information on Eliquis (apixaban): [email or web address].

## 2014-01-23 NOTE — Consult Note (Signed)
[place] for Heparin and Coumadin Indication: pulmonary embolus  Allergies  Allergen Reactions  . Sulfa Antibiotics Hives    Patient Measurements: Height: 6' (182.9 cm) Weight: 181 lb 7 oz (82.3 kg) IBW/kg (Calculated) : 77.6 Heparin Dosing Weight: 83kg  Vital Signs: Temp: 98.7 F (37.1 C) (06/01 2006) Temp src: Oral (06/01 2006) BP: 148/68 mmHg (06/01 2006) Pulse Rate: 91 (06/01 2006)  Labs:  Recent Labs  01/22/14 1218 01/23/14 0132  HGB 12.3* 11.3*  HCT 36.2* 33.9*  PLT 145* 134*  LABPROT 12.9 14.0  INR 0.99 1.10  HEPARINUNFRC  --  0.34  CREATININE 0.96 0.86    Estimated Creatinine Clearance: 76.4 ml/min (by C-G formula based on Cr of 0.86).  Assessment: 80 yo male with PE for heparin.   Goal of Therapy:  INR 2-3 Heparin level 0.3-0.7 units/ml Monitor platelets by anticoagulation protocol: Yes   Plan:  Continue Heparin at current rate Recheck level at 0800 to confirm Coumadin 7.5 mg today  Bronson Curb Rettie Laird 01/23/2014,2:42 AM

## 2014-01-23 NOTE — Progress Notes (Signed)
PROGRESS NOTE  Andrew [name] GGY:[id number] DOB: 16-Mar-1932 DOA: 01/22/2014 PCP:  Melinda Crutch, MD  Assessment/Plan: Pulmonary embolism  - lovenox -plan to start newer anticoagulant- eliquis?  Will see if this is covered, consult care manager -duplex pending  Pulmonary nodules  - seen on CT angio chest  - pulm to follow up  Parkinson's disease  - continue sinemet   Diabetes  - continue Janumet Diabetic diet   Code Status: full Family Communication: patient/wife Disposition Plan:    Consultants:  *pulm  Procedures:      HPI/Subjective: No SOB, no CP Feeling well  Objective: Filed Vitals:   01/23/14 0531  BP: 134/78  Pulse: 81  Temp: 98.2 F (36.8 C)  Resp: 18    Intake/Output Summary (Last 24 hours) at 01/23/14 1042 Last data filed at 01/23/14 0700  Gross per 24 hour  Intake 241.25 ml  Output    400 ml  Net -158.75 ml   Filed Weights   01/22/14 1500 01/22/14 1804 01/23/14 0531  Weight: 83 kg (182 lb 15.7 oz) 82.3 kg (181 lb 7 oz) 81.3 kg (179 lb 3.7 oz)    Exam:   General:  A+Ox3, NAD  Cardiovascular: rrr  Respiratory: clear anterior  Abdomen: +BS, soft  Musculoskeletal: moves all 4 ext   Data Reviewed: Basic Metabolic Panel:  Recent Labs Lab 01/22/14 1218 01/23/14 0132  NA 139 139  K 4.3 4.0  CL 101 102  CO2 21 24  GLUCOSE 197* 191*  BUN 23 19  CREATININE 0.96 0.86  CALCIUM 10.4 9.5   Liver Function Tests:  Recent Labs Lab 01/22/14 1438 01/23/14 0132  AST 15 11  ALT 19 <5  ALKPHOS 112 102  BILITOT 0.7 0.4  PROT 7.9 7.0  ALBUMIN 3.7 3.1*   No results found for this basename: LIPASE, AMYLASE,  in the last 168 hours No results found for this basename: AMMONIA,  in the last 168 hours CBC:  Recent Labs Lab 01/22/14 1218 01/23/14 0132  WBC 6.3 5.8  HGB 12.3* 11.3*  HCT 36.2* 33.9*  MCV 97.1 98.3  PLT 145* 134*   Cardiac Enzymes: No results found for this basename: CKTOTAL, CKMB, CKMBINDEX, TROPONINI,  in  the last 168 hours BNP (last 3 results)  Recent Labs  01/22/14 1218  PROBNP 84.1   CBG: No results found for this basename: GLUCAP,  in the last 168 hours  No results found for this or any previous visit (from the past 240 hour(s)).   Studies: Dg Chest 2 View  01/22/2014   CLINICAL DATA:  Fall, right-sided chest pain  EXAM: CHEST  2 VIEW  COMPARISON:  Prior chest x-ray 04/14/2013  FINDINGS: The lungs are clear and negative for focal airspace consolidation, pulmonary edema or suspicious pulmonary nodule. Stable mild central bronchitic changes. No pleural effusion or pneumothorax. Cardiac and mediastinal contours are within normal limits. Mildly ectatic and atherosclerotic thoracic aorta. Age indeterminate fracture of the lateral aspect of the right tenth rib. Stable round circumscribed density projecting over the anterior aspect of the left sixth rib. Degenerative spurring throughout the visualized spine. The visualized upper abdominal bowel gas pattern is unremarkable. Surgical clips in the right upper quadrant suggest prior cholecystectomy.  IMPRESSION: 1. Age indeterminate fracture of the lateral aspect of the right tenth rib. 2. Otherwise, no acute cardiopulmonary process. 3. Aortic atherosclerosis.   Electronically Signed   By: Jacqulynn Cadet M.D.   On: 01/22/2014 13:03   Ct Angio Chest Pe W/cm &/  or Wo Cm  01/22/2014   CLINICAL DATA:  Upper chest pain, shortness of breath, elevated D-dimer, past history hyperlipidemia, diabetes, prostate cancer  EXAM: CT ANGIOGRAPHY CHEST WITH CONTRAST  TECHNIQUE: Multidetector CT imaging of the chest was performed using the standard protocol during bolus administration of intravenous contrast. Multiplanar CT image reconstructions and MIPs were obtained to evaluate the vascular anatomy.  CONTRAST:  126mL OMNIPAQUE IOHEXOL 350 MG/ML SOLN  COMPARISON:  None  FINDINGS: Atherosclerotic calcifications aorta and coronary arteries.  No aortic aneurysm or dissection.   BILATERAL pulmonary arterial filling defects consistent with pulmonary embolism.  These include saddle emboli at the bifurcations of the RIGHT and LEFT pulmonary arteries, extending into BILATERAL lower lobes, extending less into BILATERAL upper lobes and RIGHT middle lobe.  Elevated RV:LV ratio = 1.61.  No thoracic adenopathy.  Post cholecystectomy.  Visualized portion of upper abdomen otherwise unremarkable.  Dependent atelectasis RIGHT lower lobe.  Multiple poorly defined pulmonary nodules, majority in RIGHT lung, many of which appear potentially subsolid, largest 17 x 15 mm, could represent infection or tumor.  No segmental consolidation, pleural effusion or pneumothorax.  Scattered degenerative disc disease changes thoracic spine.  Review of the MIP images confirms the above findings.  IMPRESSION: Large BILATERAL pulmonary emboli.  Positive for acute PE with CT evidence of right heart strain (RV/LV Ratio = 1.61) consistent with at least submassive (intermediate risk) PE. The presence of right heart strain has been associated with an increased risk of morbidity and mortality. Consultation with Pulmonary and Critical Care Medicine is recommended.  BILATERAL pulmonary nodules/masses, majority in RIGHT lung, largest 17 x 15 mm, many of which are potentially subsolid ; these could be the result of infection or tumor in this patient with a history of prostate cancer.  Critical Value/emergent results were called by telephone at the time of interpretation on 01/22/2014 at 1600 hrs to Dr. Stark Jock and at 1614 hrs to Dr. Revonda Humphrey, who verbally acknowledged these results.   Electronically Signed   By: Lavonia Dana M.D.   On: 01/22/2014 16:15    Scheduled Meds: . carbidopa-levodopa  1 tablet Oral 5 X Daily  . linagliptin  5 mg Oral Q breakfast   And  . metFORMIN  1,000 mg Oral BID WC  . pantoprazole  40 mg Oral Daily  . sodium chloride  3 mL Intravenous Q12H  . warfarin  7.5 mg Oral ONCE-1800  . Warfarin - Pharmacist  Dosing Inpatient   Does not apply q1800   Continuous Infusions: . heparin 1,500 Units/hr (01/23/14 0722)   Antibiotics Given (last 72 hours)   None      Principal Problem:   Pulmonary embolism Active Problems:   Parkinsonism    Time spent: 35 min    [facility] Pager [phone] If 7PM-7AM, please contact night-coverage at [email or web address], password [facility] 01/23/2014, 10:42 AM  LOS: 1 day

## 2014-01-23 NOTE — Progress Notes (Signed)
Echocardiogram 2D Echocardiogram has been performed.  Alyson Locket Avelina Mcclurkin 01/23/2014, 2:34 PM

## 2014-01-23 NOTE — Progress Notes (Signed)
Inpatient Diabetes Program Recommendations  AACE/ADA: New Consensus Statement on Inpatient Glycemic Control (2013)  Target Ranges:  Prepandial:   less than 140 mg/dL      Peak postprandial:   less than 180 mg/dL (1-2 hours)      Critically ill patients:  140 - 180 mg/dL    Inpatient Diabetes Program Recommendations Correction (SSI): consider adding Novolog sensitive scale TID  Add carbohydrate modified to current heart healthy diet Thank you  Raoul Pitch BSN, RN,CDE Inpatient Diabetes Coordinator [phone] (team pager)

## 2014-01-23 NOTE — Progress Notes (Signed)
Name: Andrew [name] MRN: [id number] DOB: July 14, 1932    ADMISSION DATE:  01/22/2014 CONSULTATION DATE:  01/23/2014  REFERRING MD :  Charlies Silvers PRIMARY SERVICE:  [facility]  CHIEF COMPLAINT:  SOB   Brief Summary:  80 year old male, never smoker with debilitating Parkinson's disease presented with generalized weakness, fatigue and dyspnea. He is feeling somewhat improved today and is not complaining of SOB.  [place] breath.  This had persisted for about 2 days when he had an appointment with his neurologist. At the neurologist's office he was noted to be tachycardic and hypoxic and sent to the emergency room. D-dimer was positive. CT angiogram showed large bilateral pulmonary emboli with CT evidence of right heart strain (RV/LV ratio was 1.6). Incidentally, multiple pulmonary nodules were noted which showed in the right lung, some of which or substernal and, the largest measured 17x15 mm.   He lives in [place] for the majority of the year and recently drove to [facility] with his wife (12 hr). He has chronic back pain.  This was exacerbated about 3 months ago with a fall.  MRI at that time was negative.  His movement is limited. He reports dysphagia to solids more than liquids. He is lost about 20 pounds over the last month.  Neurology advised him to increase carbidopa/levodopa to 5 times a day He has a remote history of prostate cancer > 50 years ago. This was treated with radiation. No personal or family history of PE/DVT noted.   SUBJECTIVE:  Pt denies SOB, chest pain, hemoptysis or spontaneous bleeding.   VITAL SIGNS: Temp:  [97.2 F (36.2 C)-98.9 F (37.2 C)] 98.2 F (36.8 C) (06/02 0531) Pulse Rate:  [81-122] 81 (06/02 0531) Resp:  [18-27] 18 (06/02 0531) BP: (118-148)/(54-92) 134/78 mmHg (06/02 0531) SpO2:  [92 %-99 %] 97 % (06/02 0531) Weight:  [179 lb 3.7 oz (81.3 kg)-183 lb (83.008 kg)] 179 lb 3.7 oz (81.3 kg) (06/02  0531)  PHYSICAL EXAMINATION: Gen. Pleasant, well-nourished, in no distress, flat affect ENT - No lesions, no post nasal drip. No bleeding from nares or gums. Neck: No JVD, no thyromegaly, no carotid bruits. No lymphadenopathy. Lungs: No use of accessory muscles, clear without rales or rhonchi  Cardiovascular: RRR, normal S1S2, no murmurs, no peripheral edema. Abdomen: Soft and non-tender, no hepatosplenomegaly, BS normal. Musculoskeletal: No deformities, no cyanosis or clubbing, no lower extremity edema. Neuro:  Alert, non focal, tremors+, cog wheel rigidity+ Skin:  Warm, no lesions or rashes noted. No bruising.    Recent Labs Lab 01/22/14 1218 01/23/14 0132  NA 139 139  K 4.3 4.0  CL 101 102  CO2 21 24  BUN 23 19  CREATININE 0.96 0.86  GLUCOSE 197* 191*    Recent Labs Lab 01/22/14 1218 01/23/14 0132  HGB 12.3* 11.3*  HCT 36.2* 33.9*  WBC 6.3 5.8  PLT 145* 134*   Dg Chest 2 View  01/22/2014   CLINICAL DATA:  Fall, right-sided chest pain  EXAM: CHEST  2 VIEW  COMPARISON:  Prior chest x-ray 04/14/2013  FINDINGS: The lungs are clear and negative for focal airspace consolidation, pulmonary edema or suspicious pulmonary nodule. Stable mild central bronchitic changes. No pleural effusion or pneumothorax. Cardiac and mediastinal contours are within normal limits. Mildly ectatic and atherosclerotic thoracic aorta. Age indeterminate fracture of the lateral aspect of the right tenth rib. Stable round circumscribed density projecting over the anterior aspect of the left  sixth rib. Degenerative spurring throughout the visualized spine. The visualized upper abdominal bowel gas pattern is unremarkable. Surgical clips in the right upper quadrant suggest prior cholecystectomy.  IMPRESSION: 1. Age indeterminate fracture of the lateral aspect of the right tenth rib. 2. Otherwise, no acute cardiopulmonary process. 3. Aortic atherosclerosis.   Electronically Signed   By: Jacqulynn Cadet M.D.   On:  01/22/2014 13:03   Ct Angio Chest Pe W/cm &/or Wo Cm  01/22/2014   CLINICAL DATA:  Upper chest pain, shortness of breath, elevated D-dimer, past history hyperlipidemia, diabetes, prostate cancer  EXAM: CT ANGIOGRAPHY CHEST WITH CONTRAST  TECHNIQUE: Multidetector CT imaging of the chest was performed using the standard protocol during bolus administration of intravenous contrast. Multiplanar CT image reconstructions and MIPs were obtained to evaluate the vascular anatomy.  CONTRAST:  118mL OMNIPAQUE IOHEXOL 350 MG/ML SOLN  COMPARISON:  None  FINDINGS: Atherosclerotic calcifications aorta and coronary arteries.  No aortic aneurysm or dissection.  BILATERAL pulmonary arterial filling defects consistent with pulmonary embolism.  These include saddle emboli at the bifurcations of the RIGHT and LEFT pulmonary arteries, extending into BILATERAL lower lobes, extending less into BILATERAL upper lobes and RIGHT middle lobe.  Elevated RV:LV ratio = 1.61.  No thoracic adenopathy.  Post cholecystectomy.  Visualized portion of upper abdomen otherwise unremarkable.  Dependent atelectasis RIGHT lower lobe.  Multiple poorly defined pulmonary nodules, majority in RIGHT lung, many of which appear potentially subsolid, largest 17 x 15 mm, could represent infection or tumor.  No segmental consolidation, pleural effusion or pneumothorax.  Scattered degenerative disc disease changes thoracic spine.  Review of the MIP images confirms the above findings.  IMPRESSION: Large BILATERAL pulmonary emboli.  Positive for acute PE with CT evidence of right heart strain (RV/LV Ratio = 1.61) consistent with at least submassive (intermediate risk) PE. The presence of right heart strain has been associated with an increased risk of morbidity and mortality. Consultation with Pulmonary and Critical Care Medicine is recommended.  BILATERAL pulmonary nodules/masses, majority in RIGHT lung, largest 17 x 15 mm, many of which are potentially subsolid ;  these could be the result of infection or tumor in this patient with a history of prostate cancer.  Critical Value/emergent results were called by telephone at the time of interpretation on 01/22/2014 at 1600 hrs to Dr. Stark Jock and at 1614 hrs to Dr. Revonda Humphrey, who verbally acknowledged these results.   Electronically Signed   By: Lavonia Dana M.D.   On: 01/22/2014 16:15    ASSESSMENT / PLAN:  Submassive PE - CT evidence of RV strain  Plan: -Continue IV heparin -Check venous Doppler for completion >> -Hemodynamically stable, change to Lovenox & continue coumadin -No indication for thrombolytic at this time  Multiple pulmonary nodules  Plan: -Unclear etiology -Infection is a possibility, socially given groundglass nature of some of these nodules, metastatic malignancy remains in the differential -Consider CT abdomen, given his unexplained weight loss-he does report negative colonoscopy in [place], we'll obtain some more of this history from his PCP, Dr Forde Dandy -Age appropriate screening for malignancy including PSA >> -If no clear etiology would consider repeat CT imaging in 6-8 weeks -Arranged for pulmonary follow up for consideration of nodule biopsy -These nodules are not amenable to biopsy at this time and treatment of embolism takes precedence  Lowella Dell. Reese PA-S2  Noe Gens, NP-C [facility] Pgr: [phone] or [phone]    01/23/2014, 9:25 AM     STAFF NOTE: I,  Dr Ann Lions have personally reviewed patient's available data, including medical history, events of note, physical examination and test results as part of my evaluation. I have discussed with resident/NP and other care providers such as pharmacist, RN and RRT.  In addition,  I personally evaluated patient and elicited key findings of a) PE - submassive but clinically might not be in right heart strain. Will check lactate, trop, bnp to determine this. Will need PESI score calculator to determine  true severity; will attempt this later. Will also get duplex LE. D/w DR Eliseo Squires of [facility]: she will discuss NOAC with patient and wife and see if coumadin or NOAC. Creat appears ok (Estimated Creatinine Clearance: 76.4 ml/min (by C-G formula based on Cr of 0.86).  B) Nodules - these could very well be pulmonary infarct. Will need opd fu.  Rest per NP/medical resident whose note is outlined above and that I agree with  Dr. Brand Males, M.D., [facility].C.P Pulmonary and Critical Care Medicine Staff Physician [facility] Pager: [phone], If no answer or between  15:00h - 7:00h: call [phone]  01/23/2014 12:23 PM

## 2014-01-23 NOTE — Progress Notes (Signed)
ANTICOAGULATION CONSULT NOTE - Follow Up Consult  Pharmacy Consult for heparin Indication: DVT  Allergies  Allergen Reactions  . Sulfa Antibiotics Hives    Patient Measurements: Height: 6' (182.9 cm) Weight: 179 lb 3.7 oz (81.3 kg) (b) IBW/kg (Calculated) : 77.6 Heparin Dosing Weight: 81 kg  Vital Signs: Temp: 98.2 F (36.8 C) (06/02 0531) Temp src: Oral (06/02 0531) BP: 134/78 mmHg (06/02 0531) Pulse Rate: 81 (06/02 0531)  Labs:  Recent Labs  01/22/14 1218 01/23/14 0132 01/23/14 0735  HGB 12.3* 11.3*  --   HCT 36.2* 33.9*  --   PLT 145* 134*  --   LABPROT 12.9 14.0  --   INR 0.99 1.10  --   HEPARINUNFRC  --  0.34 1.03*  CREATININE 0.96 0.86  --     Estimated Creatinine Clearance: 76.4 ml/min (by C-G formula based on Cr of 0.86).  Assessment: Patient is a 80 y.o M on anticoagulation overlap day #2 of 5 days minimum for bilateral PE with right heart strain.  Heparin level of 1.03 was drawn from arm with heparin drip running. Repeat heparin level now back as 1.72 (drawn on correct arm).  To transition heparin to lovenox today.  Goal of Therapy:  INR 2-3; heparin level 0.3-0.7 Monitor platelets by anticoagulation protocol: Yes   Plan:  1) coumadin 7.5mg  PO x1 today 2) d/c heparin drip 3) due to heavy clot burden, will start with lovenox 80mg  SQ q12h for now with first dose to start 1.5 hrs after heparin drip d/ced (d/t elevated Heparin level).  If lovenox is needed at discharge, could do 1.5mg /kg/day dosing to ease injection burden for patient. 4) change cbc to q72hr   Yanina Knupp P Estanislao Harmon 01/23/2014,8:27 AM

## 2014-01-23 NOTE — Progress Notes (Signed)
Utilization Review Completed.Anthonymichael Munday T Dowell6/09/2013  

## 2014-01-23 NOTE — Addendum Note (Signed)
Addended byAnnamaria Helling on: 01/23/2014 08:45 AM   Modules accepted: Orders

## 2014-01-24 ENCOUNTER — Telehealth: Payer: Self-pay | Admitting: Neurology

## 2014-01-24 ENCOUNTER — Encounter (HOSPITAL_COMMUNITY): Payer: Self-pay | Admitting: General Practice

## 2014-01-24 ENCOUNTER — Inpatient Hospital Stay (HOSPITAL_COMMUNITY): Payer: Medicare Other

## 2014-01-24 DIAGNOSIS — R053 Chronic cough: Secondary | ICD-10-CM

## 2014-01-24 DIAGNOSIS — R918 Other nonspecific abnormal finding of lung field: Secondary | ICD-10-CM

## 2014-01-24 DIAGNOSIS — R059 Cough, unspecified: Secondary | ICD-10-CM

## 2014-01-24 DIAGNOSIS — I2699 Other pulmonary embolism without acute cor pulmonale: Secondary | ICD-10-CM

## 2014-01-24 DIAGNOSIS — R05 Cough: Secondary | ICD-10-CM

## 2014-01-24 LAB — CBC
HCT: 33.9 % — ABNORMAL LOW (ref 39.0–52.0)
HEMOGLOBIN: 11.4 g/dL — AB (ref 13.0–17.0)
MCH: 32.9 pg (ref 26.0–34.0)
MCHC: 33.6 g/dL (ref 30.0–36.0)
MCV: 98 fL (ref 78.0–100.0)
PLATELETS: 171 10*3/uL (ref 150–400)
RBC: 3.46 MIL/uL — AB (ref 4.22–5.81)
RDW: 16.1 % — ABNORMAL HIGH (ref 11.5–15.5)
WBC: 6.4 10*3/uL (ref 4.0–10.5)

## 2014-01-24 LAB — PSA: PSA: 0.63 ng/mL (ref <=4.00)

## 2014-01-24 LAB — BASIC METABOLIC PANEL
BUN: 16 mg/dL (ref 6–23)
CHLORIDE: 102 meq/L (ref 96–112)
CO2: 22 mEq/L (ref 19–32)
Calcium: 9.4 mg/dL (ref 8.4–10.5)
Creatinine, Ser: 0.87 mg/dL (ref 0.50–1.35)
GFR calc Af Amer: >90 mL/min (ref >90)
GFR calc non Af Amer: 80 mL/min — ABNORMAL LOW (ref >90)
GLUCOSE: 173 mg/dL — AB (ref 70–99)
Potassium: 4.2 mEq/L (ref 3.7–5.3)
Sodium: 139 mEq/L (ref 137–147)

## 2014-01-24 LAB — GLUCOSE, CAPILLARY
GLUCOSE-CAPILLARY: 148 mg/dL — AB (ref 70–99)
Glucose-Capillary: 210 mg/dL — ABNORMAL HIGH (ref 70–99)
Glucose-Capillary: 137 mg/dL — ABNORMAL HIGH (ref 70–99)
Glucose-Capillary: 199 mg/dL — ABNORMAL HIGH (ref 70–99)

## 2014-01-24 LAB — PROTIME-INR
INR: 1.17 (ref 0.00–1.49)
PROTHROMBIN TIME: 14.7 s (ref 11.6–15.2)

## 2014-01-24 LAB — LACTIC ACID, PLASMA: Lactic Acid, Venous: 0.7 mmol/L (ref 0.5–2.2)

## 2014-01-24 MED ORDER — FLUTICASONE PROPIONATE 50 MCG/ACT NA SUSP
1.0000 | Freq: Every day | NASAL | Status: DC
  Administered 2014-01-24 – 2014-01-25 (×2): 1 via NASAL
  Filled 2014-01-24: qty 16

## 2014-01-24 MED ORDER — HYDROCOD POLST-CHLORPHEN POLST 10-8 MG/5ML PO LQCR
5.0000 mL | Freq: Two times a day (BID) | ORAL | Status: DC
  Administered 2014-01-24 – 2014-01-25 (×3): 5 mL via ORAL
  Filled 2014-01-24 (×3): qty 5

## 2014-01-24 MED ORDER — APIXABAN 5 MG PO TABS
10.0000 mg | ORAL_TABLET | Freq: Two times a day (BID) | ORAL | Status: DC
  Administered 2014-01-24 – 2014-01-25 (×2): 10 mg via ORAL
  Filled 2014-01-24 (×3): qty 2

## 2014-01-24 MED ORDER — APIXABAN 5 MG PO TABS: 5.0000 mg | ORAL_TABLET | Freq: Two times a day (BID) | ORAL | Status: DC

## 2014-01-24 MED ORDER — LEVOFLOXACIN 500 MG PO TABS
500.0000 mg | ORAL_TABLET | Freq: Every day | ORAL | Status: DC
  Administered 2014-01-24 – 2014-01-25 (×2): 500 mg via ORAL
  Filled 2014-01-24 (×2): qty 1

## 2014-01-24 NOTE — Progress Notes (Signed)
PROGRESS NOTE  Andrew [name]:[id number] DOB: 1931-11-22 DOA: 01/22/2014 PCP:  Melinda Crutch, MD  Assessment/Plan: Pulmonary embolism  - lovenox- change to PO eliquis -LM for wife- patient agreeable for NOAC -duplex positive  Pulmonary nodules  - seen on CT angio chest  - pulm to follow up- ? infarct  Parkinson's disease  - continue sinemet   Diabetes  - continue Janumet Diabetic diet   Code Status: full Family Communication: patient/wife Disposition Plan:    Consultants:  *pulm  Procedures:      HPI/Subjective: Doing well No overnight events  Objective: Filed Vitals:   01/24/14 0427  BP: 116/61  Pulse: 83  Temp: 99.7 F (37.6 C)  Resp: 18    Intake/Output Summary (Last 24 hours) at 01/24/14 1039 Last data filed at 01/24/14 0730  Gross per 24 hour  Intake    480 ml  Output    800 ml  Net   -320 ml   Filed Weights   01/22/14 1804 01/23/14 0531 01/24/14 0427  Weight: 82.3 kg (181 lb 7 oz) 81.3 kg (179 lb 3.7 oz) 80.1 kg (176 lb 9.4 oz)    Exam:   General:  A+Ox3, NAD  Cardiovascular: rrr  Respiratory: clear anterior  Abdomen: +BS, soft  Musculoskeletal: moves all 4 ext   Data Reviewed: Basic Metabolic Panel:  Recent Labs Lab 01/22/14 1218 01/23/14 0132 01/24/14 0410  NA 139 139 139  K 4.3 4.0 4.2  CL 101 102 102  CO2 21 24 22   GLUCOSE 197* 191* 173*  BUN 23 19 16   CREATININE 0.96 0.86 0.87  CALCIUM 10.4 9.5 9.4   Liver Function Tests:  Recent Labs Lab 01/22/14 1438 01/23/14 0132  AST 15 11  ALT 19 <5  ALKPHOS 112 102  BILITOT 0.7 0.4  PROT 7.9 7.0  ALBUMIN 3.7 3.1*   No results found for this basename: LIPASE, AMYLASE,  in the last 168 hours No results found for this basename: AMMONIA,  in the last 168 hours CBC:  Recent Labs Lab 01/22/14 1218 01/23/14 0132 01/24/14 0410  WBC 6.3 5.8 6.4  HGB 12.3* 11.3* 11.4*  HCT 36.2* 33.9* 33.9*  MCV 97.1 98.3 98.0  PLT 145* 134* 171   Cardiac  Enzymes:  Recent Labs Lab 01/23/14 1350  TROPONINI <0.30   BNP (last 3 results)  Recent Labs  01/22/14 1218 01/23/14 1350  PROBNP 84.1 81.6   CBG:  Recent Labs Lab 01/23/14 1643 01/23/14 2119 01/24/14 0601  GLUCAP 214* 195* 148*    No results found for this or any previous visit (from the past 240 hour(s)).   Studies: Dg Chest 2 View  01/22/2014   CLINICAL DATA:  Fall, right-sided chest pain  EXAM: CHEST  2 VIEW  COMPARISON:  Prior chest x-ray 04/14/2013  FINDINGS: The lungs are clear and negative for focal airspace consolidation, pulmonary edema or suspicious pulmonary nodule. Stable mild central bronchitic changes. No pleural effusion or pneumothorax. Cardiac and mediastinal contours are within normal limits. Mildly ectatic and atherosclerotic thoracic aorta. Age indeterminate fracture of the lateral aspect of the right tenth rib. Stable round circumscribed density projecting over the anterior aspect of the left sixth rib. Degenerative spurring throughout the visualized spine. The visualized upper abdominal bowel gas pattern is unremarkable. Surgical clips in the right upper quadrant suggest prior cholecystectomy.  IMPRESSION: 1. Age indeterminate fracture of the lateral aspect of the right tenth rib. 2. Otherwise, no acute cardiopulmonary process. 3. Aortic atherosclerosis.   Electronically  Signed   By: Jacqulynn Cadet M.D.   On: 01/22/2014 13:03   Ct Angio Chest Pe W/cm &/or Wo Cm  01/22/2014   CLINICAL DATA:  Upper chest pain, shortness of breath, elevated D-dimer, past history hyperlipidemia, diabetes, prostate cancer  EXAM: CT ANGIOGRAPHY CHEST WITH CONTRAST  TECHNIQUE: Multidetector CT imaging of the chest was performed using the standard protocol during bolus administration of intravenous contrast. Multiplanar CT image reconstructions and MIPs were obtained to evaluate the vascular anatomy.  CONTRAST:  124mL OMNIPAQUE IOHEXOL 350 MG/ML SOLN  COMPARISON:  None  FINDINGS:  Atherosclerotic calcifications aorta and coronary arteries.  No aortic aneurysm or dissection.  BILATERAL pulmonary arterial filling defects consistent with pulmonary embolism.  These include saddle emboli at the bifurcations of the RIGHT and LEFT pulmonary arteries, extending into BILATERAL lower lobes, extending less into BILATERAL upper lobes and RIGHT middle lobe.  Elevated RV:LV ratio = 1.61.  No thoracic adenopathy.  Post cholecystectomy.  Visualized portion of upper abdomen otherwise unremarkable.  Dependent atelectasis RIGHT lower lobe.  Multiple poorly defined pulmonary nodules, majority in RIGHT lung, many of which appear potentially subsolid, largest 17 x 15 mm, could represent infection or tumor.  No segmental consolidation, pleural effusion or pneumothorax.  Scattered degenerative disc disease changes thoracic spine.  Review of the MIP images confirms the above findings.  IMPRESSION: Large BILATERAL pulmonary emboli.  Positive for acute PE with CT evidence of right heart strain (RV/LV Ratio = 1.61) consistent with at least submassive (intermediate risk) PE. The presence of right heart strain has been associated with an increased risk of morbidity and mortality. Consultation with Pulmonary and Critical Care Medicine is recommended.  BILATERAL pulmonary nodules/masses, majority in RIGHT lung, largest 17 x 15 mm, many of which are potentially subsolid ; these could be the result of infection or tumor in this patient with a history of prostate cancer.  Critical Value/emergent results were called by telephone at the time of interpretation on 01/22/2014 at 1600 hrs to Dr. Stark Jock and at 1614 hrs to Dr. Revonda Humphrey, who verbally acknowledged these results.   Electronically Signed   By: Lavonia Dana M.D.   On: 01/22/2014 16:15    Scheduled Meds: . carbidopa-levodopa  1 tablet Oral 5 X Daily  . enoxaparin (LOVENOX) injection  80 mg Subcutaneous Q12H  . insulin aspart  0-15 Units Subcutaneous TID WC  . insulin  aspart  0-5 Units Subcutaneous QHS  . linagliptin  5 mg Oral Q breakfast   And  . metFORMIN  1,000 mg Oral BID WC  . pantoprazole  40 mg Oral Daily  . sodium chloride  3 mL Intravenous Q12H   Continuous Infusions:   Antibiotics Given (last 72 hours)   None      Principal Problem:   Pulmonary embolism Active Problems:   Parkinsonism    Time spent: 25 min    [facility] Pager [phone] If 7PM-7AM, please contact night-coverage at [email or web address], password [facility] 01/24/2014, 10:39 AM  LOS: 2 days

## 2014-01-24 NOTE — Telephone Encounter (Signed)
Melody returned your call, she states the patient's cell VM is full and his home number does not get answered and has no VM. She is unable to reach this pt. If pt calls [place], we can direct him to call their office and they will schedule pt / Sherri S.

## 2014-01-24 NOTE — Telephone Encounter (Signed)
Left message on machine for Andrew [name] to call back.

## 2014-01-24 NOTE — Telephone Encounter (Signed)
Melody w/ the [facility] in [place] calling regarding a referral sent for this patient. Please call [phone] / Sherri S.

## 2014-01-24 NOTE — Progress Notes (Signed)
Bilateral venous duplex completed.  Right:  DVT noted in the posterior tibial vein.  No evidence of superficial thrombosis.  No Baker's cyst.  Left:  No evidence of DVT, superficial thrombosis, or Baker's cyst.

## 2014-01-24 NOTE — Progress Notes (Addendum)
Name: Andrew [name] MRN: [id number] DOB: September 12, 1931    ADMISSION DATE:  01/22/2014 CONSULTATION DATE:  01/24/2014  REFERRING MD :  Charlies Silvers PRIMARY SERVICE:  [facility]  CHIEF COMPLAINT:  SOB   Brief Summary:  80 year old male, never smoker with debilitating Parkinson's disease presented with generalized weakness, fatigue and dyspnea. He is feeling improved today and is not complaining of any SOB.  [place] breath.  This had persisted for about 2 days when he had an appointment with his neurologist. At the neurologist's office he was noted to be tachycardic and hypoxic and sent to the emergency room. D-dimer was positive. CT angiogram showed large bilateral pulmonary emboli with CT evidence of right heart strain (RV/LV ratio was 1.6). Incidentally, multiple pulmonary nodules were noted which showed in the right lung, some of which or substernal and, the largest measured 17x15 mm. An echo was completed 6/2 and showed LVEF 60-65%.  Bil LE venous dopplers completed this am (6/3) showed DVT in R posterior tibial vein. Otherwise negative.   He lives in [place] for the majority of the year and recently drove to [facility] with his wife (12 hr). He has chronic back pain.  This was exacerbated about 3 months ago with a fall.  MRI at that time was negative.  His movement is limited. He reports dysphagia to solids more than liquids. He is lost about 20 pounds over the last month.  Neurology advised him to increase carbidopa/levodopa to 5 times a day He has a remote history of prostate cancer > 50 years ago. This was treated with radiation. No personal or family history of PE/DVT noted.   SUBJECTIVE:  Pt denies SOB, chest pain, hemoptysis or spontaneous bleeding. Reports cough with intermittent green sputum production  VITAL SIGNS: Temp:  [98.3 F (36.8 C)-99.7 F (37.6 C)] 99.7 F (37.6 C) (06/03 0427) Pulse Rate:  [83-93] 83 (06/03 0427) Resp:   [18] 18 (06/03 0427) BP: (114-123)/(61-74) 116/61 mmHg (06/03 0427) SpO2:  [97 %-98 %] 98 % (06/03 0427) Weight:  [80.1 kg (176 lb 9.4 oz)] 80.1 kg (176 lb 9.4 oz) (06/03 0427)  PHYSICAL EXAMINATION: Gen. Pleasant, well-nourished, NAD, flat affect ENT - No lesions, no post nasal drip. No bleeding from nares or gums. Neck: No JVD, no thyromegaly, no carotid bruits. No lymphadenopathy. Lungs: No use of accessory muscles, clear without rales or rhonchi.  Cardiovascular: RRR, normal S1S2, no murmurs, no peripheral edema. Abdomen: Soft and non-tender, no hepatosplenomegaly, BS normal. Musculoskeletal: No deformities, no cyanosis or clubbing, no lower extremity edema. Neuro:  Alert, non focal, tremors+, cog wheel rigidity+ Skin:  Warm, no lesions or rashes noted. No bruising.    Recent Labs Lab 01/22/14 1218 01/23/14 0132 01/24/14 0410  NA 139 139 139  K 4.3 4.0 4.2  CL 101 102 102  CO2 21 24 22   BUN 23 19 16   CREATININE 0.96 0.86 0.87  GLUCOSE 197* 191* 173*    Recent Labs Lab 01/22/14 1218 01/23/14 0132 01/24/14 0410  HGB 12.3* 11.3* 11.4*  HCT 36.2* 33.9* 33.9*  WBC 6.3 5.8 6.4  PLT 145* 134* 171   Dg Chest 2 View  01/22/2014   CLINICAL DATA:  Fall, right-sided chest pain  EXAM: CHEST  2 VIEW  COMPARISON:  Prior chest x-ray 04/14/2013  FINDINGS: The lungs are clear and negative for focal airspace consolidation, pulmonary edema or suspicious pulmonary nodule. Stable mild central bronchitic changes.  No pleural effusion or pneumothorax. Cardiac and mediastinal contours are within normal limits. Mildly ectatic and atherosclerotic thoracic aorta. Age indeterminate fracture of the lateral aspect of the right tenth rib. Stable round circumscribed density projecting over the anterior aspect of the left sixth rib. Degenerative spurring throughout the visualized spine. The visualized upper abdominal bowel gas pattern is unremarkable. Surgical clips in the right upper quadrant suggest  prior cholecystectomy.  IMPRESSION: 1. Age indeterminate fracture of the lateral aspect of the right tenth rib. 2. Otherwise, no acute cardiopulmonary process. 3. Aortic atherosclerosis.   Electronically Signed   By: Jacqulynn Cadet M.D.   On: 01/22/2014 13:03   Ct Angio Chest Pe W/cm &/or Wo Cm  01/22/2014   CLINICAL DATA:  Upper chest pain, shortness of breath, elevated D-dimer, past history hyperlipidemia, diabetes, prostate cancer  EXAM: CT ANGIOGRAPHY CHEST WITH CONTRAST  TECHNIQUE: Multidetector CT imaging of the chest was performed using the standard protocol during bolus administration of intravenous contrast. Multiplanar CT image reconstructions and MIPs were obtained to evaluate the vascular anatomy.  CONTRAST:  165mL OMNIPAQUE IOHEXOL 350 MG/ML SOLN  COMPARISON:  None  FINDINGS: Atherosclerotic calcifications aorta and coronary arteries.  No aortic aneurysm or dissection.  BILATERAL pulmonary arterial filling defects consistent with pulmonary embolism.  These include saddle emboli at the bifurcations of the RIGHT and LEFT pulmonary arteries, extending into BILATERAL lower lobes, extending less into BILATERAL upper lobes and RIGHT middle lobe.  Elevated RV:LV ratio = 1.61.  No thoracic adenopathy.  Post cholecystectomy.  Visualized portion of upper abdomen otherwise unremarkable.  Dependent atelectasis RIGHT lower lobe.  Multiple poorly defined pulmonary nodules, majority in RIGHT lung, many of which appear potentially subsolid, largest 17 x 15 mm, could represent infection or tumor.  No segmental consolidation, pleural effusion or pneumothorax.  Scattered degenerative disc disease changes thoracic spine.  Review of the MIP images confirms the above findings.  IMPRESSION: Large BILATERAL pulmonary emboli.  Positive for acute PE with CT evidence of right heart strain (RV/LV Ratio = 1.61) consistent with at least submassive (intermediate risk) PE. The presence of right heart strain has been associated  with an increased risk of morbidity and mortality. Consultation with Pulmonary and Critical Care Medicine is recommended.  BILATERAL pulmonary nodules/masses, majority in RIGHT lung, largest 17 x 15 mm, many of which are potentially subsolid ; these could be the result of infection or tumor in this patient with a history of prostate cancer.  Critical Value/emergent results were called by telephone at the time of interpretation on 01/22/2014 at 1600 hrs to Dr. Stark Jock and at 1614 hrs to Dr. Revonda Humphrey, who verbally acknowledged these results.   Electronically Signed   By: Lavonia Dana M.D.   On: 01/22/2014 16:15    ASSESSMENT / PLAN:  Submassive PE - CT evidence of RV strain  Plan: -transition to Eliquis -Venous doppler completed 6/3>>> + for DVT in R post. tib vein, non-mobile -Echo completed 6/2>>> LV EF 60-65% -No indication for thrombolytic at this time -RECOMMEND no travel for minimum of one month - [place] where he at baseline has difficulty with dyspnea.    Multiple pulmonary nodules  Plan: -Unclear etiology -Infection is a possibility, socially given groundglass nature of some of these nodules, metastatic malignancy remains in the differential but could be pulmonary infarct -Consider CT abdomen, given his unexplained weight loss-he does report negative colonoscopy in [place], we'll obtain some more of this history from his PCP, Dr  [place] -Age appropriate screening for malignancy including PSA >> -If no clear etiology would consider repeat CT imaging in 6-8 weeks -Arranged for pulmonary follow up for consideration of nodule biopsy -These nodules are not amenable to biopsy at this time and treatment of embolism takes precedence   Chronic Cough since oct 2015  with acute worsening 01/24/14 and green sputum  Plan: Tussionex BID, will need for d/c for cough suppression Flonase for nasal hygiene PPI Will follow up in pulmonary office with Dr. Chase Caller  Assess   CXR Levaquin x 5 days   Parkinson's Disease  Plan: -Continue Sinemet per Dr. Eliseo Squires  Diabetes Mellitus  Plan: -Continue Janumet per Dr. Eliseo Squires -Consider adding Novolog sens scale TID per [facility] -Diabetes diet   GLOBAL: Likely to be able to d/c in am 6/4 assuming no change in symptoms and anti-coagulation sorted out.    Lowella Dell. Reese PA-S2  Noe Gens, NP-C [facility] Pgr: [phone] or [phone]   01/24/2014, 8:44 AM    STAFF note  - see NP note for details. I updated his 2 daughers at bedside and wife over phone. Also updated Dr Lucita Ferrara his friend at bedside. Patient might have old CD rom of CT chest when he seems me 02/12/14 at 13.45. PCCM will sign off.   D/w Dr Eliseo Squires of [facility]   Dr. Brand Males, M.D., [place].C.P Pulmonary and Critical Care Medicine Staff Physician [facility] Pager: [phone], If no answer or between  15:00h - 7:00h: call [phone]  01/24/2014 1:17 PM

## 2014-01-24 NOTE — Progress Notes (Addendum)
ANTICOAGULATION CONSULT NOTE - Initial Consult  Pharmacy Consult for Eliquis Indication: pulmonary embolus  Allergies  Allergen Reactions  . Sulfa Antibiotics Hives    Patient Measurements: Height: 6' (182.9 cm) Weight: 176 lb 9.4 oz (80.1 kg) IBW/kg (Calculated) : 77.6   Vital Signs: Temp: 99.7 F (37.6 C) (06/03 0427) Temp src: Oral (06/03 0427) BP: 116/61 mmHg (06/03 0427) Pulse Rate: 83 (06/03 0427)  Labs:  Recent Labs  01/22/14 1218 01/23/14 0132 01/23/14 0735 01/23/14 0912 01/23/14 1350 01/24/14 0410  HGB 12.3* 11.3*  --   --   --  11.4*  HCT 36.2* 33.9*  --   --   --  33.9*  PLT 145* 134*  --   --   --  171  LABPROT 12.9 14.0  --   --   --  14.7  INR 0.99 1.10  --   --   --  1.17  HEPARINUNFRC  --  0.34 1.03* 1.72*  --   --   CREATININE 0.96 0.86  --   --   --  0.87  TROPONINI  --   --   --   --  <0.30  --     Estimated Creatinine Clearance: 75.6 ml/min (by C-G formula based on Cr of 0.87).   Medical History: Past Medical History  Diagnosis Date  . Diabetes mellitus   . Hyperlipidemia   . Prostate cancer   . Diverticulosis   . Internal hemorrhoids   . Adenomatous colon polyp 2004    Assessment: 21 YOM with new PE. Originally started on heparin + warfarin, then transitioned to Lovenox. Now to start Eliquis for treatment of new clot. Age, weight, and SCr do not qualify him for reduced dose- can proceed with full dose. Next dose of Lovenox due at 2300 tonight.  Hgb 11.4- low but stable. Platelets increased to 171. No bleeding noted.  Goal of Therapy:    Monitor platelets by anticoagulation protocol: Yes   Plan:   1. Start Eliquis 10mg  PO BID tonight at 2200 (1 hour before next dose of Lovenox). This continues for 7 days, and then on 6/11 will reduce to Eliquis 5mg  PO BID. 2. CBC q72h 3. Follow renal function and for any s/s bleeding 4. Will provide education for patient/wife  Aviyah Swetz D. Norelle Runnion, PharmD, BCPS Clinical Pharmacist Pager:  [phone] 01/24/2014 10:56 AM

## 2014-01-24 NOTE — Care Management Note (Unsigned)
    Page 1 of 1   01/24/2014     3:03:15 PM CARE MANAGEMENT NOTE 01/24/2014  Patient:  Andrew [name], Andrew [name]   Account Number:  [id number]  Date Initiated:  01/24/2014  Documentation initiated by:  Rosezella Kronick  Subjective/Objective Assessment:   Pt adm on 01/22/14 with PE, Rt DVT.  PTA, pt independent, lives with spouse.     Action/Plan:   CM referral to check Eliquis coverage.   Anticipated DC Date:  01/25/2014   Anticipated DC Plan:  [place]  CM consult  Medication Assistance      Choice offered to / List presented to:             Status of service:  In process, will continue to follow Medicare Important Message given?   (If response is "NO", the following Medicare IM given date fields will be blank) Date Medicare IM given:   Date Additional Medicare IM given:    Discharge Disposition:    Per UR Regulation:  Reviewed for med. necessity/level of care/duration of stay  If discussed at [place] of Stay Meetings, dates discussed:    Comments:  01/24/14 Ellan Lambert, RN, BSN [phone]   eliquis: 10mg : is covered; $144.61; no auth required  5mg : is covered; $144.61; no auth required   patient can use most major retail pharmacies   Will give pt free 30 day trial card.

## 2014-01-25 ENCOUNTER — Inpatient Hospital Stay (HOSPITAL_COMMUNITY): Payer: Medicare Other

## 2014-01-25 LAB — GLUCOSE, CAPILLARY
GLUCOSE-CAPILLARY: 213 mg/dL — AB (ref 70–99)
Glucose-Capillary: 166 mg/dL — ABNORMAL HIGH (ref 70–99)

## 2014-01-25 MED ORDER — FLUTICASONE PROPIONATE 50 MCG/ACT NA SUSP: 1.0000 | Freq: Every day | NASAL | Status: DC

## 2014-01-25 MED ORDER — APIXABAN 5 MG PO TABS: 5.0000 mg | ORAL_TABLET | Freq: Two times a day (BID) | ORAL | Status: DC

## 2014-01-25 MED ORDER — LEVOFLOXACIN 500 MG PO TABS: 500.0000 mg | ORAL_TABLET | Freq: Every day | ORAL | Status: DC

## 2014-01-25 MED ORDER — APIXABAN 5 MG PO TABS: 10.0000 mg | ORAL_TABLET | Freq: Two times a day (BID) | ORAL | Status: DC

## 2014-01-25 MED ORDER — HYDROCOD POLST-CHLORPHEN POLST 10-8 MG/5ML PO LQCR: 5.0000 mL | Freq: Two times a day (BID) | ORAL | Status: DC

## 2014-01-25 NOTE — Evaluation (Signed)
Physical Therapy Evaluation Patient Details Name: Andrew [name] MRN: [id number] DOB: Jan 03, 1932 Today's Date: 01/25/2014   History of Present Illness  Pt adm with PE and DVT, Pt with hx of parkinsons.  Clinical Impression  Pt admitted with above. Pt currently with functional limitations due to the deficits listed below (see PT Problem List).  Pt will benefit from skilled PT to increase their independence and safety with mobility to allow discharge to home. Will follow acutely but expect pt will return to baseline when resuming normal activities.     Follow Up Recommendations No PT follow up;Supervision/Assistance - 24 hour (for initial few days)    Equipment Recommendations  None recommended by PT    Recommendations for Other Services       Precautions / Restrictions Precautions Precautions: Fall      Mobility  Bed Mobility Overal bed mobility: Modified Independent                Transfers Overall transfer level: Needs assistance Equipment used: None Transfers: Sit to/from Stand Sit to Stand: Supervision         General transfer comment: supervision for safety   Ambulation/Gait Ambulation/Gait assistance: Min guard Ambulation Distance (Feet): 250 Feet Assistive device: None Gait Pattern/deviations: Step-through pattern;Decreased step length - right;Decreased step length - left   Gait velocity interpretation: Below normal speed for age/gender General Gait Details: slight unsteadiness requiring min guard for safety.  Stairs            Wheelchair Mobility    Modified Rankin (Stroke Patients Only)       Balance Overall balance assessment: Needs assistance         Standing balance support: No upper extremity supported Standing balance-Leahy Scale: Good                               Pertinent Vitals/Pain No c/o's    Home Living Family/patient expects to be discharged to:: Private residence Living Arrangements:  Spouse/significant other   Type of Home: House Home Access: Stairs to enter   [employer] of Steps: 2-3 Home Layout: Full bath on main level;Multi-level;Bed/bath upstairs Home Equipment: None Additional Comments[place]. Prefer to return to house in [place].    Prior Function Level of Independence: Independent               Hand Dominance        Extremity/Trunk Assessment   Upper Extremity Assessment: Overall WFL for tasks assessed           Lower Extremity Assessment: Generalized weakness         Communication   Communication: No difficulties  Cognition Arousal/Alertness: Awake/alert Behavior During Therapy: WFL for tasks assessed/performed Overall Cognitive Status: Within Functional Limits for tasks assessed       Memory: Decreased short-term memory              General Comments      Exercises        Assessment/Plan    PT Assessment Patient needs continued PT services  PT Diagnosis Difficulty walking   PT Problem List Decreased balance;Decreased mobility  PT Treatment Interventions Gait training;Functional mobility training;Balance training;Patient/family education   PT Goals (Current goals can be found in the Care Plan section) Acute Rehab PT Goals Patient Stated Goal: go home. Plans to stay at home in [facility] for a few weeks and then  [place]. PT Goal Formulation: With patient Time For Goal Achievement: 01/29/14 Potential to Achieve Goals: Good    Frequency Min 3X/week   Barriers to discharge        Co-evaluation               End of Session Equipment Utilized During Treatment: Gait belt Activity Tolerance: Patient tolerated treatment well Patient left: in chair;with call bell/phone within reach Nurse Communication: Mobility status         Time: 1213-1230 PT Time Calculation (min): 17 min   Charges:   PT Evaluation $Initial PT  Evaluation Tier I: 1 Procedure PT Treatments $Gait Training: 8-22 mins   PT G CodesShary Decamp Thanh Mottern 01/25/2014, 2:06 PM  [employer] PT [phone]

## 2014-01-25 NOTE — Discharge Summary (Signed)
Physician Discharge Summary  Andrew [name] [id number] DOB: 09-01-1931 DOA: 01/22/2014  PCP:  Melinda Crutch, MD  Admit date: 01/22/2014 Discharge date: 01/25/2014  Time spent: 35 minutes  Recommendations for Outpatient Follow-up:  1. D/c'd on eliquis 2. pulm follow up for nodules  Discharge Diagnoses:  Principal Problem:   Pulmonary embolism Active Problems:   Parkinsonism   Chronic cough   Multiple pulmonary nodules   Discharge Condition:  improved  Diet recommendation: cardiac  Filed Weights   01/23/14 0531 01/24/14 0427 01/25/14 0553  Weight: 81.3 kg (179 lb 3.7 oz) 80.1 kg (176 lb 9.4 oz) 80.6 kg (177 lb 11.1 oz)    History of present illness:  80 year old male with past medical history of parkinson's disease who presented to [facility] ED 01/22/2014 with progressive generalized weakness, recent treatment of bronchitis. Pt reported not feeling significantly better even with completion of antibiotics. He had seen his PCP in the office and was found to have tachycardia and hypoxia although he was not hypoxic in ED. He was sent to ED for further evaluation. He feels better at this time. He has no complaints of chest pian, fever or chills. No palpitations. He had small amount of productive cough in ED with streaks of blood. No hematemesis. No lightheadedness or loss of consciousness.  In ED, BP was 118/54, HR 81-122, T max 98.9 F and oxygen saturation 93% on room air. His 12 lead EKG showed sinus tachycardia. His CT angio chest was positive for pulmonary embolism. Also seen were multiple nodules concerning for tumor versus infection. He was started on coumadin and heparin for anticoagulation.   Hospital Course:  Pulmonary embolism  - lovenox- change to PO eliquis  -duplex positive  -did not require O2 -pulm rec no travel [place]  Pulmonary nodules  - seen on CT angio chest  - pulm to follow up- ? infarct vs infection- given 5 days of levaquin  Parkinson's disease   - continue sinemet  -ambulated well with PT  Diabetes  - continue Janumet  Diabetic diet   Procedures: Echo: Study Conclusions  - Left ventricle: The cavity size was normal. There was moderate concentric hypertrophy. Systolic function was normal. The estimated ejection fraction was in the range of 60% to 65%. - Aortic valve: moderately calcified leaflet tips with no stenosis. - Mitral valve: Calcified annulus. Mildly thickened leaflets . - Atrial septum: No defect or patent foramen ovale was identified.   Consultations:  PT  pulm  Discharge Exam: Filed Vitals:   01/25/14 1218  BP:   Pulse: 76  Temp:   Resp:     General: A+Ox3, NAD Cardiovascular: rrr Respiratory: clear anterior, no wheezing  Discharge Instructions You were cared for by a hospitalist during your hospital stay. If you have any questions about your discharge medications or the care you received while you were in the hospital after you are discharged, you can call the unit and asked to speak with the hospitalist on call if the hospitalist that took care of you is not available. Once you are discharged, your primary care physician will handle any further medical issues. Please note that NO REFILLS for any discharge medications will be authorized once you are discharged, as it is imperative that you return to your primary care physician (or establish a relationship with a primary care physician if you do not have one) for your aftercare needs so that they can reassess your need for medications and monitor your  lab values.      Discharge Instructions   Diet - low sodium heart healthy    Complete by:  As directed      Diet Carb Modified    Complete by:  As directed      Discharge instructions    Complete by:  As directed   Pulmonology recommends against travel for at least 2-4 weeks; follow up appointment with pulm- please bring copy of any CT scans of chest/abd/ pelvis     Increase activity slowly     Complete by:  As directed             Medication List    STOP taking these medications       celecoxib 200 MG capsule  Commonly known as:  CELEBREX     naproxen sodium 220 MG tablet  Commonly known as:  ANAPROX      TAKE these medications       apixaban 5 MG Tabs tablet  Commonly known as:  ELIQUIS  Take 2 tablets (10 mg total) by mouth 2 (two) times daily.     apixaban 5 MG Tabs tablet  Commonly known as:  ELIQUIS  Take 1 tablet (5 mg total) by mouth 2 (two) times daily.  Start taking on:  02/01/2014     b complex vitamins tablet  Take 1 tablet by mouth daily.     carbidopa-levodopa 25-100 MG per tablet  Commonly known as:  SINEMET IR  Take 1 tablet by mouth 5 (five) times daily.     chlorpheniramine-HYDROcodone 10-8 MG/5ML Lqcr  Commonly known as:  TUSSIONEX  Take 5 mLs by mouth every 12 (twelve) hours.     esomeprazole 40 MG capsule  Commonly known as:  NEXIUM  Take 30- 60 min before your first and last meals of the day     fluticasone 50 MCG/ACT nasal spray  Commonly known as:  FLONASE  Place 1 spray into both nostrils daily.     levofloxacin 500 MG tablet  Commonly known as:  LEVAQUIN  Take 1 tablet (500 mg total) by mouth daily.     sitaGLIPtin-metformin 50-1000 MG per tablet  Commonly known as:  JANUMET  Take 1 tablet by mouth 2 (two) times daily with a meal.     VITAMIN D (CHOLECALCIFEROL) PO  Take 1 capsule by mouth daily.       Allergies  Allergen Reactions  . Sulfa Antibiotics Hives   Follow-up Information   Follow up with [facility], MD On 03/06/2014. (Appt at 10:30 )    Specialty:  Pulmonary Disease   Contact information:   [address] [place]  [address] [phone]        The results of significant diagnostics from this hospitalization (including imaging, microbiology, ancillary and laboratory) are listed below for reference.    Significant Diagnostic Studies: Dg Chest 2 View  01/25/2014   CLINICAL DATA:  Productive  cough  EXAM: CHEST  2 VIEW at 6:35 a.m.  COMPARISON:  PA and lateral chest x-ray of January 24, 2014 at 3:14 p.m.  FINDINGS: The lungs are adequately inflated and clear. The heart and mediastinal structures are within the limits of normal. There is calcification in the wall of the descending thoracic aorta. There is no pleural effusion or pneumothorax. The bony thorax is unremarkable.  IMPRESSION: There is no acute cardiopulmonary disease.   Electronically Signed   By: David  Martinique   On: 01/25/2014 08:08   Dg Chest 2 View  01/24/2014   CLINICAL DATA:  Pneumonia.  EXAM: CHEST  2 VIEW  COMPARISON:  January 22, 2014.  FINDINGS: The heart size and mediastinal contours are within normal limits. Both lungs are clear. No pneumothorax or pleural effusion is noted. The visualized skeletal structures are unremarkable.  IMPRESSION: No acute cardiopulmonary abnormality seen.   Electronically Signed   By: Sabino Dick M.D.   On: 01/24/2014 14:45   Dg Chest 2 View  01/22/2014   CLINICAL DATA:  Fall, right-sided chest pain  EXAM: CHEST  2 VIEW  COMPARISON:  Prior chest x-ray 04/14/2013  FINDINGS: The lungs are clear and negative for focal airspace consolidation, pulmonary edema or suspicious pulmonary nodule. Stable mild central bronchitic changes. No pleural effusion or pneumothorax. Cardiac and mediastinal contours are within normal limits. Mildly ectatic and atherosclerotic thoracic aorta. Age indeterminate fracture of the lateral aspect of the right tenth rib. Stable round circumscribed density projecting over the anterior aspect of the left sixth rib. Degenerative spurring throughout the visualized spine. The visualized upper abdominal bowel gas pattern is unremarkable. Surgical clips in the right upper quadrant suggest prior cholecystectomy.  IMPRESSION: 1. Age indeterminate fracture of the lateral aspect of the right tenth rib. 2. Otherwise, no acute cardiopulmonary process. 3. Aortic atherosclerosis.   Electronically Signed    By: Jacqulynn Cadet M.D.   On: 01/22/2014 13:03   Ct Angio Chest Pe W/cm &/or Wo Cm  01/22/2014   CLINICAL DATA:  Upper chest pain, shortness of breath, elevated D-dimer, past history hyperlipidemia, diabetes, prostate cancer  EXAM: CT ANGIOGRAPHY CHEST WITH CONTRAST  TECHNIQUE: Multidetector CT imaging of the chest was performed using the standard protocol during bolus administration of intravenous contrast. Multiplanar CT image reconstructions and MIPs were obtained to evaluate the vascular anatomy.  CONTRAST:  1108mL OMNIPAQUE IOHEXOL 350 MG/ML SOLN  COMPARISON:  None  FINDINGS: Atherosclerotic calcifications aorta and coronary arteries.  No aortic aneurysm or dissection.  BILATERAL pulmonary arterial filling defects consistent with pulmonary embolism.  These include saddle emboli at the bifurcations of the RIGHT and LEFT pulmonary arteries, extending into BILATERAL lower lobes, extending less into BILATERAL upper lobes and RIGHT middle lobe.  Elevated RV:LV ratio = 1.61.  No thoracic adenopathy.  Post cholecystectomy.  Visualized portion of upper abdomen otherwise unremarkable.  Dependent atelectasis RIGHT lower lobe.  Multiple poorly defined pulmonary nodules, majority in RIGHT lung, many of which appear potentially subsolid, largest 17 x 15 mm, could represent infection or tumor.  No segmental consolidation, pleural effusion or pneumothorax.  Scattered degenerative disc disease changes thoracic spine.  Review of the MIP images confirms the above findings.  IMPRESSION: Large BILATERAL pulmonary emboli.  Positive for acute PE with CT evidence of right heart strain (RV/LV Ratio = 1.61) consistent with at least submassive (intermediate risk) PE. The presence of right heart strain has been associated with an increased risk of morbidity and mortality. Consultation with Pulmonary and Critical Care Medicine is recommended.  BILATERAL pulmonary nodules/masses, majority in RIGHT lung, largest 17 x 15 mm, many of  which are potentially subsolid ; these could be the result of infection or tumor in this patient with a history of prostate cancer.  Critical Value/emergent results were called by telephone at the time of interpretation on 01/22/2014 at 1600 hrs to Dr. Stark Jock and at 1614 hrs to Dr. Revonda Humphrey, who verbally acknowledged these results.   Electronically Signed   By: Lavonia Dana M.D.   On: 01/22/2014 16:15    Microbiology:  No results found for this or any previous visit (from the past 240 hour(s)).   Labs: Basic Metabolic Panel:  Recent Labs Lab 01/22/14 1218 01/23/14 0132 01/24/14 0410  NA 139 139 139  K 4.3 4.0 4.2  CL 101 102 102  CO2 21 24 22   GLUCOSE 197* 191* 173*  BUN 23 19 16   CREATININE 0.96 0.86 0.87  CALCIUM 10.4 9.5 9.4   Liver Function Tests:  Recent Labs Lab 01/22/14 1438 01/23/14 0132  AST 15 11  ALT 19 <5  ALKPHOS 112 102  BILITOT 0.7 0.4  PROT 7.9 7.0  ALBUMIN 3.7 3.1*   No results found for this basename: LIPASE, AMYLASE,  in the last 168 hours No results found for this basename: AMMONIA,  in the last 168 hours CBC:  Recent Labs Lab 01/22/14 1218 01/23/14 0132 01/24/14 0410  WBC 6.3 5.8 6.4  HGB 12.3* 11.3* 11.4*  HCT 36.2* 33.9* 33.9*  MCV 97.1 98.3 98.0  PLT 145* 134* 171   Cardiac Enzymes:  Recent Labs Lab 01/23/14 1350  TROPONINI <0.30   BNP: BNP (last 3 results)  Recent Labs  01/22/14 1218 01/23/14 1350  PROBNP 84.1 81.6   CBG:  Recent Labs Lab 01/24/14 1106 01/24/14 1606 01/24/14 2017 01/25/14 0604 01/25/14 1112  GLUCAP 210* 137* 199* 166* 213*       Signed:  Geradine Girt  [facility] 01/25/2014, 3:49 PM

## 2014-01-25 NOTE — Progress Notes (Signed)
D/c instructions reviewed with pt and his wife. Copy of instructions and scripts given to pt. Pt to get dressed and call desk when ready to be discharged.

## 2014-01-25 NOTE — Progress Notes (Signed)
Pt d/c'd via wheelchair with belongings, with wife, escorted by unit NT.

## 2014-01-25 NOTE — Progress Notes (Signed)
Nutrition Brief Note  Patient identified on the Malnutrition Screening Tool (MST) Report for recent weight lost without trying and eating poorly because of a decreased appetite.  Wt Readings from Last 15 Encounters:  01/25/14 177 lb 11.1 oz (80.6 kg)  01/22/14 183 lb (83.008 kg)  05/11/13 200 lb (90.719 kg)  04/14/13 200 lb (90.719 kg)  03/14/13 202 lb (91.627 kg)    Body mass index is 24.09 kg/(m^2). Patient meets criteria for Normal based on current BMI.   Current diet order is Heart Healthy/Carbohydrate Modified, patient's average consumption is approximately 80% of meals at this time. Labs and medications reviewed.   No nutrition interventions warranted at this time. If nutrition issues arise, please consult RD.   Arthur Holms, RD, LDN Pager #: [phone] After-Hours Pager #: [phone]

## 2014-01-26 ENCOUNTER — Telehealth: Payer: Self-pay | Admitting: Neurology

## 2014-01-26 MED ORDER — CARBIDOPA-LEVODOPA 25-100 MG PO TABS: 1.0000 | ORAL_TABLET | Freq: Every day | ORAL | Status: DC

## 2014-01-26 NOTE — Telephone Encounter (Signed)
Pt's spouse called requesting to speak to a nurse regarding his meds.

## 2014-01-26 NOTE — Telephone Encounter (Signed)
Returning call your call [phone] please call asap

## 2014-01-26 NOTE — Telephone Encounter (Signed)
Spoke with patient's wife. [place] not allowed [place] due to patient's pulmonary embolism. She has already picked up the Carbidopa Levodopa and it is at their house in [place]. They are asking me to send in a prescription to a local pharmacy. I made her aware if she already picked up the RX insurance might not allow it - but Levodopa called to [facility] in [place] per her request.

## 2014-01-26 NOTE — Telephone Encounter (Signed)
Left message on machine for patient to call back.

## 2014-01-31 ENCOUNTER — Telehealth: Payer: Self-pay | Admitting: Internal Medicine

## 2014-01-31 NOTE — Telephone Encounter (Signed)
Spoke with spouse. Made her aware pt will need to be seen. appt scheduled to see MW as he last saw pt in 2014. Nothing further needed

## 2014-02-02 ENCOUNTER — Ambulatory Visit (INDEPENDENT_AMBULATORY_CARE_PROVIDER_SITE_OTHER): Payer: Medicare Other | Admitting: Internal Medicine

## 2014-02-02 ENCOUNTER — Encounter: Payer: Self-pay | Admitting: Internal Medicine

## 2014-02-02 VITALS — BP 108/58 | HR 90 | Ht 72.0 in | Wt 185.0 lb

## 2014-02-02 DIAGNOSIS — R0600 Dyspnea, unspecified: Secondary | ICD-10-CM | POA: Insufficient documentation

## 2014-02-02 DIAGNOSIS — R918 Other nonspecific abnormal finding of lung field: Secondary | ICD-10-CM

## 2014-02-02 DIAGNOSIS — R053 Chronic cough: Secondary | ICD-10-CM

## 2014-02-02 DIAGNOSIS — R059 Cough, unspecified: Secondary | ICD-10-CM

## 2014-02-02 DIAGNOSIS — R0989 Other specified symptoms and signs involving the circulatory and respiratory systems: Secondary | ICD-10-CM

## 2014-02-02 DIAGNOSIS — R05 Cough: Secondary | ICD-10-CM

## 2014-02-02 DIAGNOSIS — R0609 Other forms of dyspnea: Secondary | ICD-10-CM

## 2014-02-02 DIAGNOSIS — I2699 Other pulmonary embolism without acute cor pulmonale: Secondary | ICD-10-CM

## 2014-02-02 NOTE — Assessment & Plan Note (Signed)
Classic Upper airway cough syndrome, so named because it's frequently impossible to sort out how much is  CR/sinusitis with freq throat clearing (which can be related to primary GERD)   vs  causing  secondary (" extra esophageal")  GERD from wide swings in gastric pressure that occur with throat clearing, often  promoting self use of mint and menthol lozenges that reduce the lower esophageal sphincter tone and exacerbate the problem further in a cyclical fashion.   These are the same pts (now being labeled as having "irritable larynx syndrome" by some cough centers) who not infrequently have a history of having failed to tolerate ace inhibitors,  dry powder inhalers or biphosphonates or report having atypical reflux symptoms that don't respond to standard doses of PPI , and are easily confused as having aecopd or asthma flares by even experienced allergists/ pulmonologists.  For now add h1 and h2 at hs and f/u in 3 months, sooner if needed

## 2014-02-02 NOTE — Assessment & Plan Note (Signed)
-   02/02/2014  Walked RA x 3 laps @ 185 ft each stopped due to  End of study, no sob or desat

## 2014-02-02 NOTE — Patient Instructions (Addendum)
Stop hydrocodone and take chlortrimeton (chlorpheniramine) 4 mg at bedtime along with pepcid 20 mg (both over the counter)  If still coughing use delsym as needed (over the counter)  I will defer to Dr Harrington Challenger if/when to stop the eliquis as there is a chance of clotting if you stop it and bleeding if you continue it and this will need to be balanced at each follow up visit to be sure the benefit is still greater than the risk but now clearly you should stay on it or an alternative anticoagulant of Dr Alan Ripper choice.  Pulmonary follow up is as needed

## 2014-02-02 NOTE — Assessment & Plan Note (Signed)
See CTa 01/23/14 > rx elaquis x 6 m minimal - Echo 01/23/14 neg PAH/ RV strain - Venous dopplers 01/24/14 Findings consistent with acute deep vein thrombosis involving the right lower extremity  Ideally needs 6 months of elaquis or xarelto if tolerates then recheck  Venous dopplers at that point and decide risk / benefit as his parkinson's dz puts him at  Risk of falling / bleeding. Defer f/u to Dr Harrington Challenger unless requested otherwise as Dr Harrington Challenger has a better handle of serial Functional status/fall risk.

## 2014-02-02 NOTE — Assessment & Plan Note (Addendum)
See CT chest 01/22/14  BILATERAL pulmonary nodules/masses, majority in RIGHT lung, largest  17 x 15 mm, many of which are potentially subsolid ; these could be  the result of infection or tumor in this patient with a history of  prostate cancer. - PSA 0.63  01/23/14 so unlikely to be prostate ca but no need for w/u at this point as certainly can't do any kind of biopsy so rec ov in 3 months and consider PET scan then if any showing up on plain CXR  (placed in tickle file for recall).  Most likely they are benign; however, given the mulitple sites noted, there is no early option for surgical cure at this point unless one grows relative to the others (suggesting two separate processes)   He does have profound failure to thrive and it's certainly possible this is metastatic non-prostate ca and defer to Dr Harrington Challenger whether to purse looking for a primary at this point because again there is a limit to what can be offered here in terms of invasive procedures, at least in the next 3 months

## 2014-02-02 NOTE — Progress Notes (Signed)
Subjective:    Patient ID: Andrew [name], male    DOB: [birth year]  MRN: [id number]    Brief patient profile:  77 yowm never smoker never respiratory problems of any kind referred  to pulmonary clinic 04/14/2013  By  Dr Melinda Crutch with new onset cough and sob starting around May 2014 .   History of Present Illness  04/14/2013 1st [name]/ [name] cc loss of voice fairly abrupt onset  "either a year ago or May of 2014, he's just not sure" but thinks was eval several months eval by ENT in [place] with  No dx, assoc with loss breath, dry cough, really limted by back more than breathing.  Already rx ppi and rx for parkinson's dz by Dr Tat but no better yet. No frank asp events to date or worsening around meal time.  rec Change nexium to 40 mg Take 30- 60 min before your first and last meals of the day  GERD  Diet F/u ENT [place] > "he did nothing for me"  Car ride back from [place] May 2015  then one week laterSOB> dx pe  > eliquis   Admit date: 01/22/2014  Discharge date: 01/25/2014  Time spent: 35 minutes  Recommendations for Outpatient Follow-up:  1. D/c'd on eliquis 2. pulm follow up for nodules Discharge Diagnoses:  Principal Problem:  Pulmonary embolism  Active Problems:  Parkinsonism  Chronic cough  Multiple pulmonary nodules   02/02/2014 post hosp ov/[name] re: new PE / abn CT  Chief Complaint  Patient presents with  . Follow-up    Pt saw MR in hospital last week in [facility] for blood clots in lungs.  C/o fatigue, nausea, SOB.  Interested in getting 02 to help with breathing.   Now Able to walk dogs around block >  ? Nausea after discharge only new complaint ? Related to use of tussionex?  Cough continues dry worse at hs  No obvious day to day or daytime variabilty or assoc  cp or chest tightness, subjective wheeze overt sinus or hb symptoms. No unusual exp hx or h/o childhood pna/ asthma or knowledge of premature birth.  Sleeping ok without nocturnal  or  early am exacerbation  of respiratory  c/o's or need for noct saba. Also denies any obvious fluctuation of symptoms with weather or environmental changes or other aggravating or alleviating factors except as outlined above   Current Medications, Allergies, Complete Past Medical History, Past Surgical History, Family History, and Social History were reviewed in [employer] record.  ROS  The following are not active complaints unless bolded sore throat, dysphagia, dental problems, itching, sneezing,  nasal congestion or excess/ purulent secretions, ear ache,   fever, chills, sweats, unintended wt loss, pleuritic or exertional cp, hemoptysis,  orthopnea pnd or leg swelling, presyncope, palpitations, heartburn, abdominal pain, anorexia, nausea, vomiting, diarrhea  or change in bowel or urinary habits, change in stools or urine, dysuria,hematuria,  rash, arthralgias, visual complaints, headache, numbness weakness or ataxia or problems with walking or coordination,  change in mood/affect or memory.                    Objective:   Physical Exam  02/02/2014         Wt Readings from Last 3 Encounters:  04/14/13 200 lb (90.719 kg)  03/14/13 202 lb (91.627 kg)   amb wm with severe voice fatigue and  Mild pseudowheeze   HEENT: nl dentition, turbinates, and orophanx.  Nl external ear canals without cough reflex   NECK :  without JVD/Nodes/TM/ nl carotid upstrokes bilaterally   LUNGS: no acc muscle use, clear to A and P bilaterally without cough on insp or exp maneuvers   CV:  RRR  no s3 or murmur or increase in P2, no edema   ABD:  soft and nontender with nl excursion in the supine position. No bruits or organomegaly, bowel sounds nl  MS:  warm without deformities, calf tenderness, cyanosis or clubbing  SKIN: warm and dry without lesions    NEURO:  alert, approp, no deficits    CXR  04/14/2013 :  No acute cardiopulmonary abnormality seen.      Assessment &  Plan:

## 2014-02-08 ENCOUNTER — Ambulatory Visit: Payer: Medicare Other | Admitting: Neurology

## 2014-02-12 ENCOUNTER — Inpatient Hospital Stay: Payer: Medicare Other | Admitting: Internal Medicine

## 2014-02-12 ENCOUNTER — Telehealth: Payer: Self-pay | Admitting: Internal Medicine

## 2014-02-13 NOTE — Telephone Encounter (Signed)
[place] x 1 - Per Daneil Dan appt was canceled because pt seen MW on 02/05/14 for post hosp f/u.

## 2014-02-14 NOTE — Telephone Encounter (Signed)
This is up to Dr Harrington Challenger - if pt or Dr Harrington Challenger would like me to see him for this problem I should probably see him every 6 weeks - my though was that he needs to be seen anyway by Dr Harrington Challenger who typically sees "the whole pt" and is in better position to evaluate the risk vs beneft of continuing anticoagulation indefinitely.

## 2014-02-14 NOTE — Telephone Encounter (Signed)
Spoke with patient- Pt wanting to know who will continue to follow him for his anticoagulation therapy and who will ensure he receives adequate care to prevent future clots Explained to the patient as AVS stated at 02/02/14 visit-- I will defer to Dr Harrington Challenger if/when to stop the eliquis as there is a chance of clotting if you stop it and bleeding if you continue it and this will need to be balanced at each follow up visit to be sure the benefit is still greater than the risk but now clearly you should stay on it or an alternative anticoagulant of Dr Alan Ripper choice.  A woman got on the phone (not sure if this was his daughter) requesting a call back ASAP with a plan of care. States that she wants to know who will continue to follow the patient if we have no plans to do so.  Please advise Dr Melvyn Novas. Thanks.

## 2014-02-14 NOTE — Telephone Encounter (Signed)
Spoke with the pt and his spouse and notified of recs per MR  They verbalized understanding and nothing further needed

## 2014-02-16 ENCOUNTER — Other Ambulatory Visit: Payer: Self-pay | Admitting: Gastroenterology

## 2014-02-16 DIAGNOSIS — R1310 Dysphagia, unspecified: Secondary | ICD-10-CM

## 2014-02-22 ENCOUNTER — Ambulatory Visit
Admission: RE | Admit: 2014-02-22 | Discharge: 2014-02-22 | Disposition: A | Discharge status: Home / Self Care | Payer: Medicare Other | Source: Ambulatory Visit | Attending: Gastroenterology | Admitting: Gastroenterology

## 2014-02-22 DIAGNOSIS — R1310 Dysphagia, unspecified: Secondary | ICD-10-CM

## 2014-03-06 ENCOUNTER — Inpatient Hospital Stay: Payer: Medicare Other | Admitting: Internal Medicine

## 2014-03-13 ENCOUNTER — Other Ambulatory Visit (HOSPITAL_COMMUNITY): Payer: Self-pay | Admitting: Gastroenterology

## 2014-03-13 DIAGNOSIS — R1310 Dysphagia, unspecified: Secondary | ICD-10-CM

## 2014-03-14 ENCOUNTER — Inpatient Hospital Stay (HOSPITAL_COMMUNITY): Admission: RE | Admit: 2014-03-14 | Payer: Medicare Other | Source: Ambulatory Visit

## 2014-03-14 ENCOUNTER — Ambulatory Visit (HOSPITAL_COMMUNITY): Admission: RE | Admit: 2014-03-14 | Payer: Medicare Other | Source: Ambulatory Visit

## 2014-03-15 ENCOUNTER — Other Ambulatory Visit (HOSPITAL_COMMUNITY): Payer: Self-pay | Admitting: Gastroenterology

## 2014-03-15 DIAGNOSIS — R1310 Dysphagia, unspecified: Secondary | ICD-10-CM

## 2014-03-20 ENCOUNTER — Inpatient Hospital Stay (HOSPITAL_COMMUNITY): Admission: RE | Admit: 2014-03-20 | Payer: Medicare Other | Source: Ambulatory Visit

## 2014-03-20 ENCOUNTER — Ambulatory Visit (HOSPITAL_COMMUNITY): Admission: RE | Admit: 2014-03-20 | Payer: Medicare Other | Source: Ambulatory Visit

## 2014-03-21 ENCOUNTER — Telehealth: Payer: Self-pay | Admitting: Internal Medicine

## 2014-03-21 NOTE — Telephone Encounter (Signed)
Spoke with pt's spouse, states pt has difficulty swallowing, feels like "something is lodged in throat".  Coughed up a thick piece of bloody "tissue".  Pt still having nonprod cough since coughing that mass up last night, SOB unchanged, difficulty eating solid foods Xseveral weeks.  Denies fever, chills, chest tightness. Was scheduled to have a scope yesterday to help evaluate swallowing difficulty, had to be cancelled due to him being on blood thinners.  Pt's wife wants to know if this is coming from pt's blood clots in lungs.  Pt lives over 3 hours away, declined an appt for today or tomorrow.  Is scheduled for Friday at 60 with PW.  Advised pt's wife to contact us or seek emergency care if pt's s/s worsen before appt.  Nothing further needed at this time.   While trying to document, wife called back, spoke with PCP office, they have recommended that the pt go to the ED and they are doing so.  I will cancel the appt scheduled for Friday, advised them to contact us if anything worsens or if they need to reschedule this appt.  Nothing further needed at this time.

## 2014-03-23 ENCOUNTER — Ambulatory Visit: Payer: Medicare Other | Admitting: Critical Care Medicine

## 2014-04-09 ENCOUNTER — Ambulatory Visit: Payer: Medicare Other | Admitting: Neurology

## 2014-04-15 ENCOUNTER — Telehealth: Payer: Self-pay | Admitting: Neurology

## 2014-04-15 NOTE — Telephone Encounter (Signed)
Pt no showed 04/09/14 appt w/ Dr. Carles Collet. No show letter mailed to pt / Sherri S.

## 2014-04-16 ENCOUNTER — Encounter: Payer: Self-pay | Admitting: *Deleted

## 2014-04-18 ENCOUNTER — Ambulatory Visit: Payer: Medicare Other | Admitting: Neurology

## 2014-04-20 ENCOUNTER — Ambulatory Visit: Payer: Medicare Other | Admitting: Neurology

## 2014-05-04 ENCOUNTER — Telehealth: Payer: Self-pay | Admitting: *Deleted

## 2014-05-04 NOTE — Telephone Encounter (Signed)
Andrew [name], will you call pt and make sure okay.  He missed last appt and just cancelled f/u and I just want to make sure that hes okay or has found new neurologist.

## 2014-05-04 NOTE — Telephone Encounter (Signed)
Patient canceled his follow up appointment

## 2014-05-07 ENCOUNTER — Ambulatory Visit: Payer: Medicare Other | Admitting: Neurology

## 2014-05-07 ENCOUNTER — Telehealth: Payer: Self-pay | Admitting: Neurology

## 2014-05-07 NOTE — Telephone Encounter (Signed)
Called and spoke with patient's wife that stated appt was cancelled due to family emergency and that they were never unsatisfied with our office. Appt rescheduled to next week.

## 2014-05-07 NOTE — Telephone Encounter (Signed)
Pt called this morning requesting a f/u appt with Dr. Carles Collet. Pt canceled his f/u he had for today 05/07/14. Pt reason for cancellation was Due to him not being satisfied with our office but now he wants to schedule a f/u since he will be in town.  Is it okay to schedule a f/u?

## 2014-05-15 ENCOUNTER — Ambulatory Visit (INDEPENDENT_AMBULATORY_CARE_PROVIDER_SITE_OTHER): Payer: Medicare Other | Admitting: Neurology

## 2014-05-15 ENCOUNTER — Telehealth: Payer: Self-pay | Admitting: Internal Medicine

## 2014-05-15 ENCOUNTER — Encounter: Payer: Self-pay | Admitting: Neurology

## 2014-05-15 VITALS — BP 142/68 | HR 60 | Ht 72.0 in | Wt 202.0 lb

## 2014-05-15 DIAGNOSIS — G231 Progressive supranuclear ophthalmoplegia [Steele-Richardson-Olszewski]: Secondary | ICD-10-CM

## 2014-05-15 DIAGNOSIS — G238 Other specified degenerative diseases of basal ganglia: Secondary | ICD-10-CM

## 2014-05-15 DIAGNOSIS — H532 Diplopia: Secondary | ICD-10-CM | POA: Insufficient documentation

## 2014-05-15 DIAGNOSIS — R918 Other nonspecific abnormal finding of lung field: Secondary | ICD-10-CM

## 2014-05-15 NOTE — Telephone Encounter (Signed)
Ok with me 

## 2014-05-15 NOTE — Telephone Encounter (Signed)
ATC PT LINE BUSY X 3 WCB

## 2014-05-15 NOTE — Progress Notes (Signed)
Andrew [name] was seen today in the movement disorders clinic for neurologic consultation at the request of Dr. Forde Dandy.  The consultation is for the evaluation of tremor and to r/o PD.  This patient is accompanied in the office by his spouse who supplements the history.   The first symptom(s) the patient noticed was tremor in the R pointer finger.  It started 3 months ago.  It was at rest.  He went to a neurologist in [place], [place].  He was not given a dx but was started on primidone.  He takes 50 mg tid and it seems to work.  His wife is not convinced that it really has helped, but the patient thinks that it does.  The patient underwent back surgery in [place] in March.  He states that the weakness and slowness got much worse after that.  He was very slow to recover.  His wife states that he was "zombie like."  He previously was very active with golfing and could not exercise.  He had no physical therapy.  Over the last month or so, he has gotten much stronger.  His wife actually thought he was so bad that he had a stroke and she took him to the hospital and a CT was performed that was unremarkable, with the exception of atrophy.  05/11/13 update:  The patient is following up today regarding his parkinsonism.  He was started on carbidopa/levodopa last visit and thinks that it has been somewhat helpful.  He remains on primidone, and thinks that it helps the tremor significantly, so does not want to go off of that and asks for a refill.  The patient is getting ready to return to [place], [place] where he lives most of the year.  He will be gone from October until next June.  He has had no falls.  He has had no hallucinations.  He admits to tremor.  He has had no dizziness or lightheadedness.  He continues to struggle with chronic back pain.  He continues to play golf, but states that his golf game is not as good as it used to be.  01/22/14 update:  The patient is following up today regarding his parkinsonism.   He has not been seen since September of last year, as he lives in [place] of the large majority of the year.  He is accompanied by his wife who supplements the history. He is on carbidopa/levodopa 25/100, one tablet 3 times a day (5am/noon/7pm) but his PCP in [place] took him off of the primidone.   Tremor has been increasing.  When he first went on the carbidopa/levodopa, his thinking and tremor and weakness and facial masking were improved but now all of those have deteriorated and he has noted tremor on the opposite side now as well.  One fall; slipped on a wooden floor.  His voice has been weaker.  He has been more difficult to understand.  No hallucinations.  His wife thinks that he is not thinking as clearly as in the past, but it may have improved somewhat going off the primidone.  05/15/14 update:  The patient is following up today.  He has a history of progressive supranuclear palsy.  He is supposed to be on current carbidopa/levodopa 25/100 five times per day, but is actually taking 2 tablets at 8am, 2 at 2 pm and 1 at 10 pm.  No falls.  Diplopia if holds paper up close but not if held far away or if closes  eye.  Last visit, I sent him to the emergency room because of tachycardia.  He was diagnosed with bilateral pulmonary emboli.  Multiple pulmonary nodules were identified, but those could not be biopsied at the time because of the acute issues.  He is supposed to be f/u with pulmonary but admits that he isn't.  States that he didn't know about pulmonary nodules.  PCP d/c his eloquis for esophageal dilation but then that wasn't done.  [place], sent him to swallow therapy after that and then the swallow therapist saw him once and said couldn't help.  Pt going back to [place] for 6 months on 10/7.  No falls.      PREVIOUS MEDICATIONS: none to date  ALLERGIES:   Allergies  Allergen Reactions  . Sulfa Antibiotics Hives    CURRENT MEDICATIONS:    Medication  List       This list is accurate as of: 05/15/14 12:48 PM.  Always use your most recent med list.               b complex vitamins tablet  Take 1 tablet by mouth daily.     carbidopa-levodopa 25-100 MG per tablet  Commonly known as:  SINEMET IR  Take 1 tablet by mouth 5 (five) times daily.     esomeprazole 40 MG capsule  Commonly known as:  NEXIUM  Take 30- 60 min before your first and last meals of the day     GLIPIZIDE PO  Take by mouth daily.     VITAMIN D (CHOLECALCIFEROL) PO  Take 1 capsule by mouth daily.        PAST MEDICAL HISTORY:   Past Medical History  Diagnosis Date  . Hyperlipidemia   . Diverticulosis   . Internal hemorrhoids   . Adenomatous colon polyp 2004  . Type II diabetes mellitus   . Chronic bronchitis     "get it q yr" (01/24/2014)  . GERD (gastroesophageal reflux disease)   . Prostate cancer 1955    S/P radiation txs    PAST SURGICAL HISTORY:   Past Surgical History  Procedure Laterality Date  . Cholecystectomy  1980  . Total knee arthroplasty Right   . Lasik    . Lumbar disc surgery  10/2012    L1-L3; Laser Spine in [place]"  . Appendectomy    . Inguinal hernia repair Right     SOCIAL HISTORY:   History   Social History  . Marital Status: Married    Spouse Name: N/A    Number of Children: N/A  . Years of Education: N/A   Occupational History  . retired     cross country trucking   Social History Main Topics  . Smoking status: Never Smoker   . Smokeless tobacco: Never Used  . Alcohol Use: 0.6 oz/week    1 Shots of liquor per week  . Drug Use: No  . Sexual Activity: Yes   Other Topics Concern  . Not on file   Social History Narrative  . No narrative on file    FAMILY HISTORY:   Family Status  Relation Status Death Age  . Mother Deceased     CA, throat  . Father Deceased 27  . Brother Deceased     2, one in [place]; one old age  . Sister Deceased 54  . Sister Deceased     "old age"  . Sister Alive     2,  DM-2,  hyperlipidemia, CAD  . Child Alive     2, one with hx of breast CA    ROS:  Denies SOB today.    A complete 10 system review of systems was obtained and was unremarkable apart from what is mentioned above.  PHYSICAL EXAMINATION:    VITALS:   Filed Vitals:   05/15/14 1131  BP: 142/68  Pulse: 60  Height: 6' (1.829 m)  Weight: 202 lb (91.627 kg)    GEN:  The patient appears stated age and is in NAD. HEENT:  Normocephalic, atraumatic.  The mucous membranes are moist. The superficial temporal arteries are without ropiness or tenderness. CV:  RRR Lungs:  CTAB  Neck/HEME:  There are no carotid bruits bilaterally.  Neurological examination:  Orientation: The patient is alert and oriented x3. Fund of knowledge is appropriate.  Recent and remote memory are intact.  Attention and concentration are normal.    Able to name objects and repeat phrases. Cranial nerves: There is good facial symmetry.  There is significant facial hypomimia. Pupils are equal round and reactive to light bilaterally. Fundoscopic exam reveals clear margins bilaterally. Extraocular muscles are intact but there may be slight downgaze paresis. There are square wave jerks.  The visual fields are full to confrontational testing. The speech is hypophonic, dysarthric and he has difficulty with the guttural sounds.  It is fluent. Soft palate rises symmetrically and there is no tongue deviation. Hearing is intact to conversational tone. Motor: Strength is 5/5 in the bilateral upper and lower extremities.   Shoulder shrug is equal and symmetric.  There is no pronator drift.   Movement examination: Tone: There is normal tone bilaterally today Abnormal movements: There is a bilateral upper extremity resting tremor.  This is overall mild and intermittent bilaterally. Coordination:  There is definite decremation with RAM's, seen bilaterally and with all forms of rapid alternating movements. Gait and Station: The patient has no  difficulty arising out of a deep-seated chair without the use of the hands. The patient's stride length is decreased.  He has steppage gait on the L today (but was not able to identify foot drop, although walked as if he had it)  ASSESSMENT/PLAN:  1.  PSP.    -I am going to continue him on carbidopa/levodopa 25/100.  He isn't taking it 5 times per day as RX, so at least asked him to move the dosages closer together so that he takes 2 in the AM, 2 before lunch and 1 before dinner.  Risks, benefits, side effects and alternative therapies were discussed.  The opportunity to ask questions was given and they were answered to the best of my ability.  The patient expressed understanding and willingness to follow the outlined treatment protocols. 2.  Recent PE bilaterally with pulmonary nodules  -needs pulmonary f/u.  Pt states unaware of pulm nodules (wonder if forgot).  He is going back to [facility] for 6 months soon as he leaves for winter.  Wants to see Dr. Chase Caller who he saw in hospital.  Will try to arrange for f/u. 3.  F/U with me after he returns from [facility]

## 2014-05-15 NOTE — Telephone Encounter (Signed)
Please advise MR thanks 

## 2014-05-16 NOTE — Telephone Encounter (Signed)
Pt wants an appt w/ MR today while in town.  Pt can be reached at [phone] or [phone].   Satira Anis

## 2014-05-16 NOTE — Telephone Encounter (Signed)
[place] for me MR to see him. Please see if you can work him in before 05/30/14 but not tomorrow 05/17/2014   THanks  Dr. Brand Males, M.D., [facility].C.P Pulmonary and Critical Care Medicine Staff Physician [place] [facility] Pager: [phone], If no answer or between  15:00h - 7:00h: call [phone]  05/16/2014 10:27 AM

## 2014-05-16 NOTE — Telephone Encounter (Signed)
Pt aware we are awaiting a response from MR. He wants to be worked in as well to see MR to make sure it is fine for him to travel.  Please advise thanks

## 2014-05-16 NOTE — Telephone Encounter (Signed)
Called pt. Offered appt Friday to work in. He reports he will have to call me back. Will await call

## 2014-05-17 NOTE — Telephone Encounter (Signed)
lmtcb x1 

## 2014-05-21 NOTE — Telephone Encounter (Signed)
Called and spoke with pt and he is aware of appt on Friday with MR.  Nothing further is needed.

## 2014-06-04 ENCOUNTER — Telehealth: Payer: Self-pay | Admitting: Emergency Medicine

## 2014-06-04 ENCOUNTER — Ambulatory Visit: Payer: Medicare Other | Admitting: Internal Medicine

## 2014-06-04 NOTE — Telephone Encounter (Signed)
Called and spoke to pt. Informed pt that we will have to reschedule appt d/t MR having to be at [place]. Pt stated "that is fine, I am leaving for [place] today anyway." I advised pt that we would like to reschedule his appt to before he leaves. Pt stated "ok" and then hung up the phone.   Called pt back and left vm to call back to reschedule appt.

## 2014-06-07 NOTE — Telephone Encounter (Signed)
lmtcb for pt.  

## 2014-06-20 NOTE — Telephone Encounter (Signed)
Called and left message advising pt to call back if needed and if wanting to schedule appt. Nothing further needed.

## 2014-10-30 ENCOUNTER — Telehealth: Payer: Self-pay | Admitting: Neurology

## 2014-10-30 NOTE — Telephone Encounter (Signed)
Medical records request received and forwarded to medical records.

## 2015-01-03 IMAGING — RF DG ESOPHAGUS
12 of 15 series · 19 of 24 positions shown · non-contrast
Comparison: None.

CLINICAL DATA: Dysphagia

EXAM:
ESOPHOGRAM/BARIUM SWALLOW
TECHNIQUE: Single contrast examination was performed using  thin barium.
FLUOROSCOPY TIME:  2 min 18 seconds

[Series 1: run · 6 of 15 slices shown (1 of 12)]
[im 1/15]
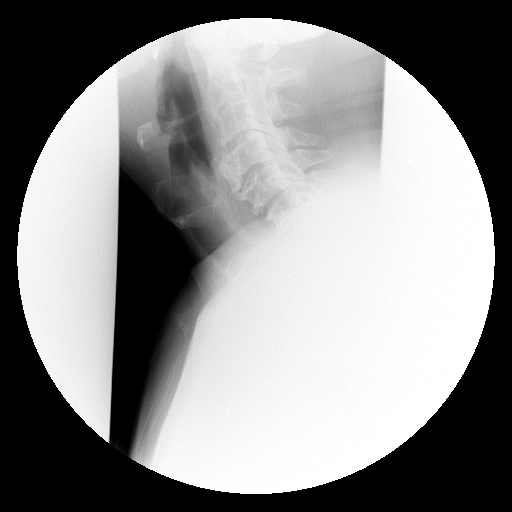
[im 3/15]
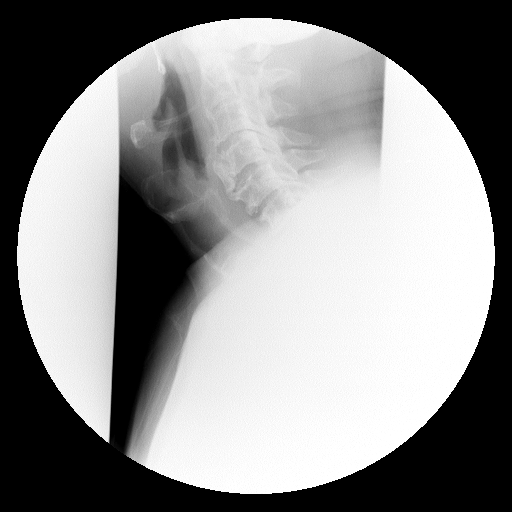
[im 8/15]
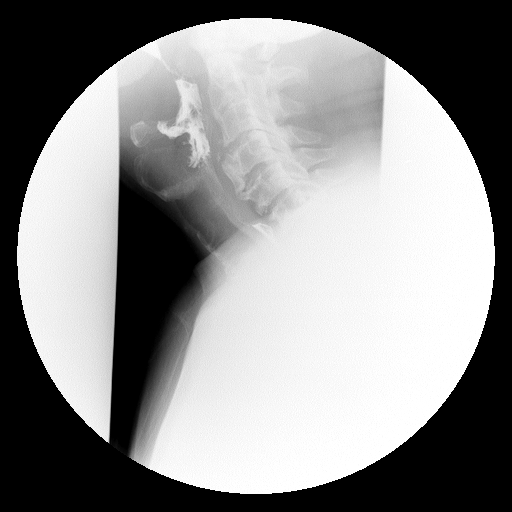
[im 10/15]
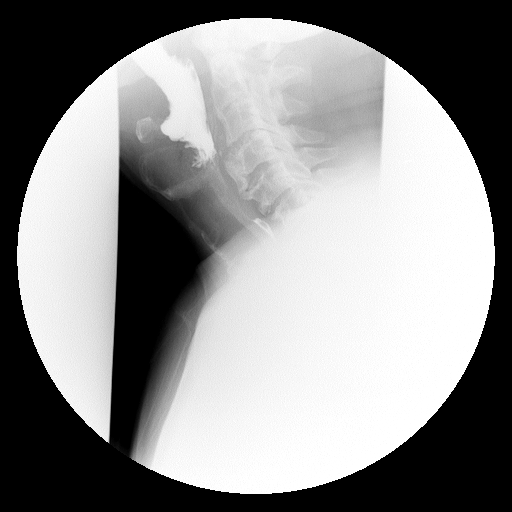
[im 12/15]
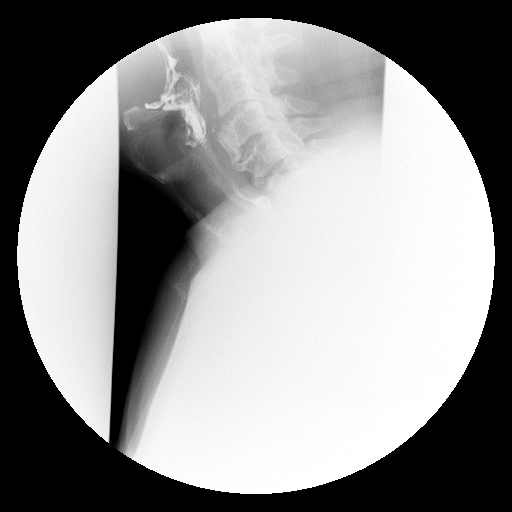
[im 15/15]
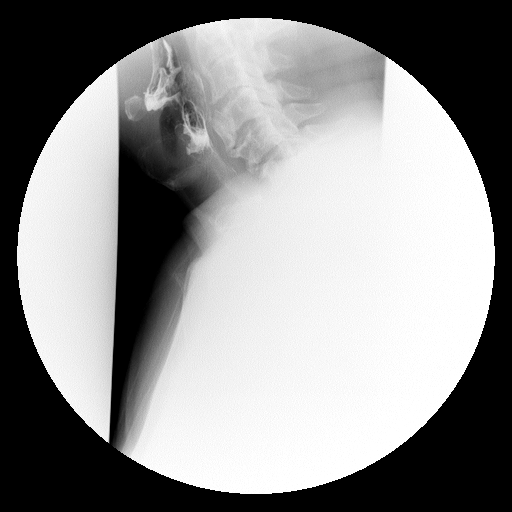

[Series 2: run · 3 of 10 slices shown (2 of 12)]
[im 4/10]
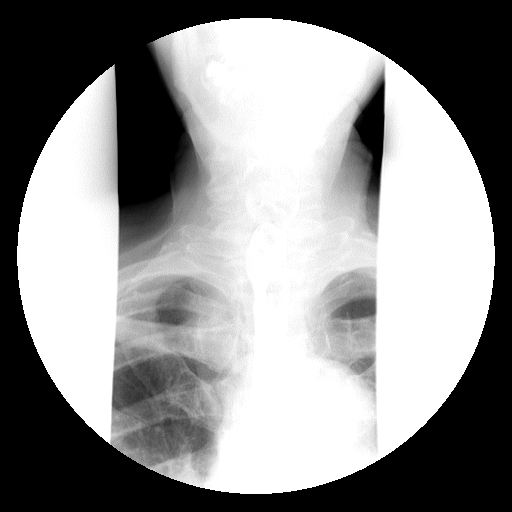
[im 7/10]
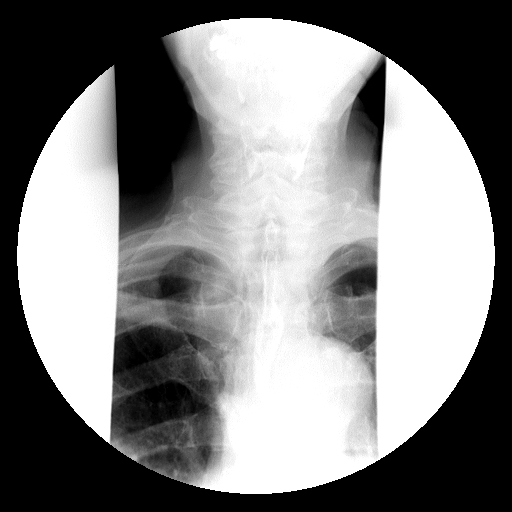
[im 10/10]
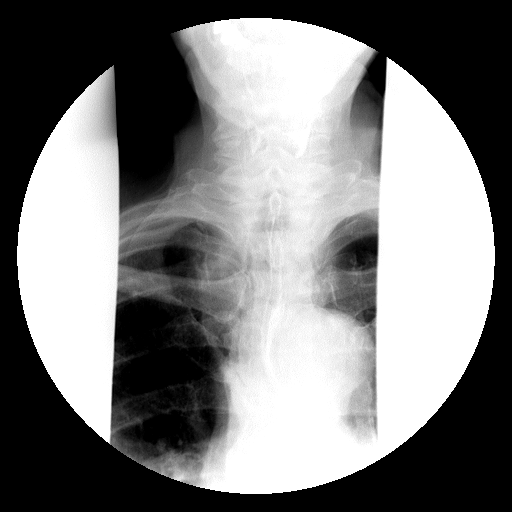

[Series 6: run · 1 of 1 slices shown (3 of 12)]
[im 1/1]
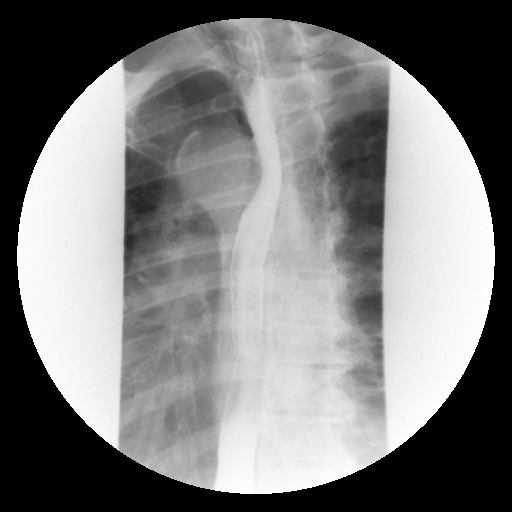

[Series 7: run · 1 of 1 slices shown (4 of 12)]
[im 1/1]
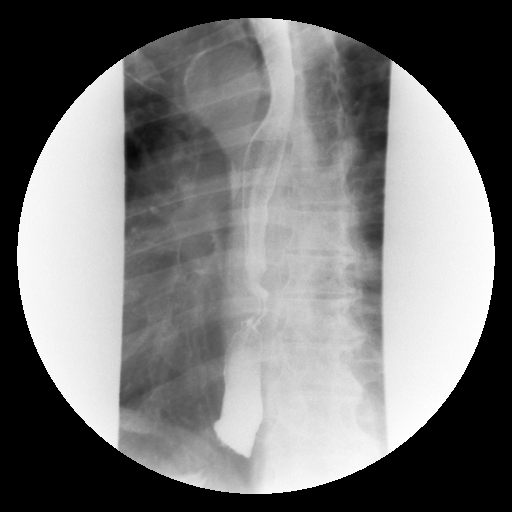

[Series 8: run · 1 of 1 slices shown (5 of 12)]
[im 1/1]
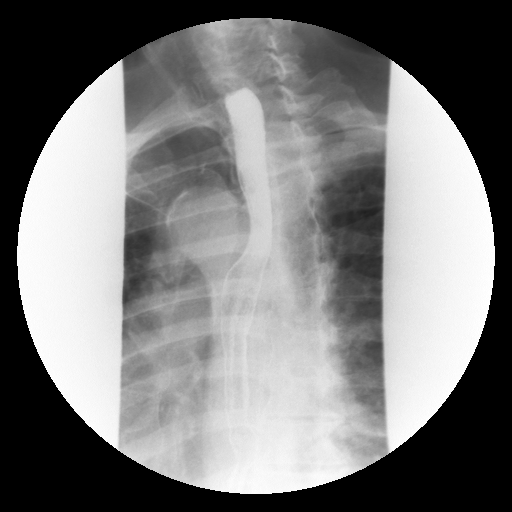

[Series 9: run · 1 of 1 slices shown (6 of 12)]
[im 1/1]
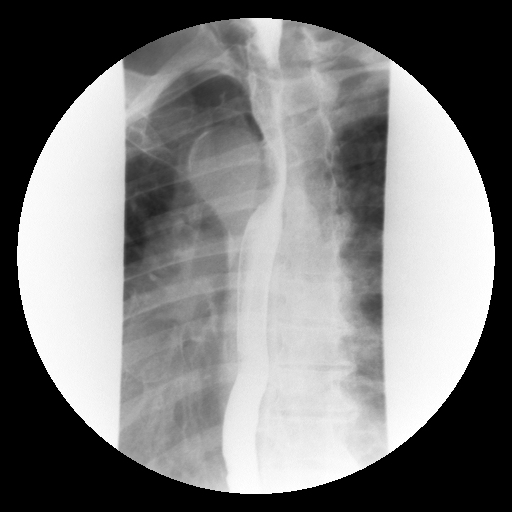

[Series 11: run · 1 of 1 slices shown (7 of 12)]
[im 1/1]
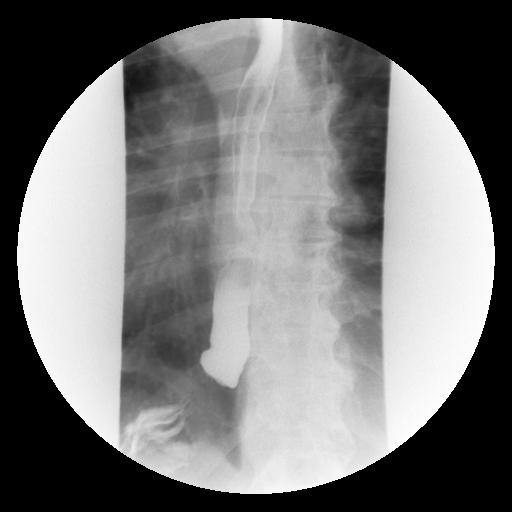

[Series 12: run · 1 of 1 slices shown (8 of 12)]
[im 1/1]
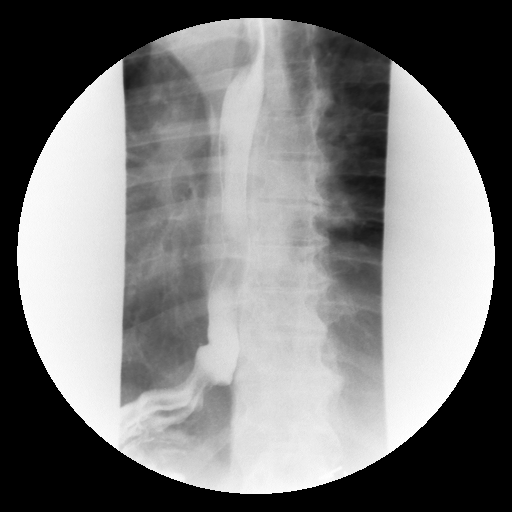

[Series 13: run · 1 of 1 slices shown (9 of 12)]
[im 1/1]
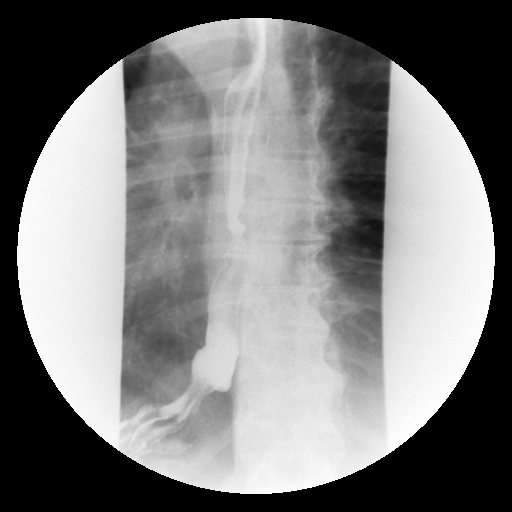

[Series 14: run · 1 of 1 slices shown (10 of 12)]
[im 1/1]
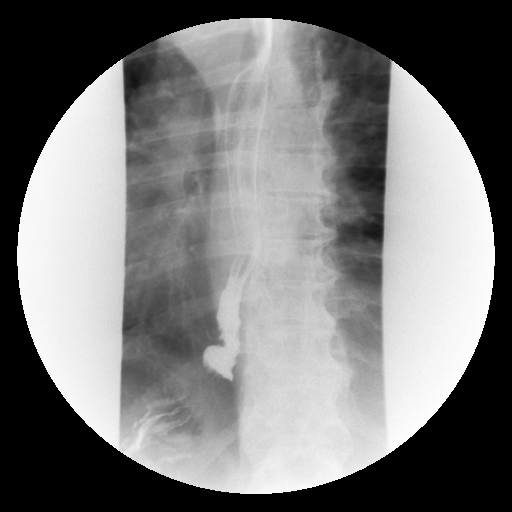

[Series 16: run · 1 of 1 slices shown (11 of 12)]
[im 1/1]
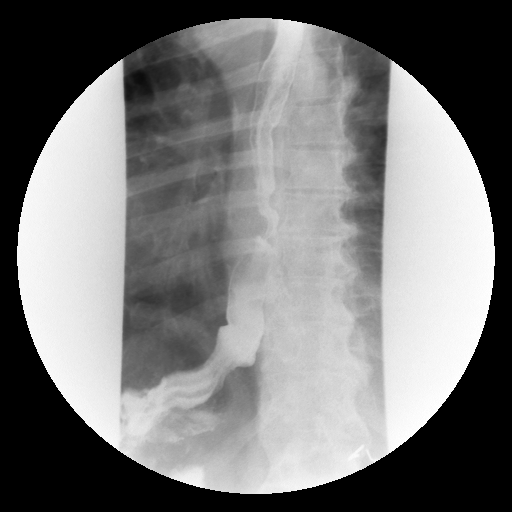

[Series 17: run · 1 of 1 slices shown (12 of 12)]
[im 1/1]
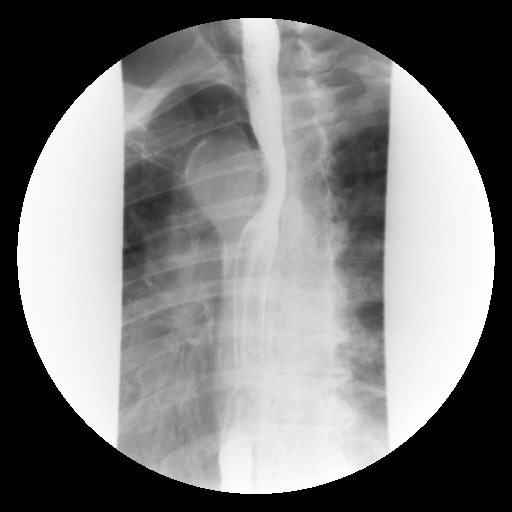

[19 of 24 positions shown; findings below may reference images not displayed]

FINDINGS: The study was begun in the lateral projection in this patient who
does give a history of coughing after swallowing. No aspiration is
seen. There are mild tertiary contractions in the mid and distal
esophagus. There does appear to be a small hiatal hernia present. No
gastroesophageal reflux could be demonstrated. A barium pill was
given at the end of the study which passed into the stomach without
delay.
IMPRESSION: 1. Mild tertiary contractions in the mid and distal esophagus.
2. Small hiatal hernia. No definite reflux. Barium pill passes into
the stomach without delay.
3. No aspiration .

## 2015-01-08 ENCOUNTER — Ambulatory Visit: Payer: Medicare Other | Admitting: Neurology

## 2015-01-08 DIAGNOSIS — Z029 Encounter for administrative examinations, unspecified: Secondary | ICD-10-CM

## 2015-01-15 ENCOUNTER — Encounter: Payer: Self-pay | Admitting: Neurology

## 2015-03-25 DEATH — deceased
